# Patient Record
Sex: Female | Born: 1960
Health system: Southern US, Community
[De-identification: ages and names within clinical notes are randomized; demographics above are authoritative.]

## PROBLEM LIST (undated history)

## (undated) DIAGNOSIS — T4145XA Adverse effect of unspecified anesthetic, initial encounter: Secondary | ICD-10-CM

## (undated) DIAGNOSIS — K219 Gastro-esophageal reflux disease without esophagitis: Secondary | ICD-10-CM

## (undated) DIAGNOSIS — N83209 Unspecified ovarian cyst, unspecified side: Secondary | ICD-10-CM

## (undated) DIAGNOSIS — J45909 Unspecified asthma, uncomplicated: Secondary | ICD-10-CM

## (undated) DIAGNOSIS — F410 Panic disorder [episodic paroxysmal anxiety] without agoraphobia: Secondary | ICD-10-CM

## (undated) DIAGNOSIS — R0600 Dyspnea, unspecified: Secondary | ICD-10-CM

## (undated) DIAGNOSIS — Z87442 Personal history of urinary calculi: Secondary | ICD-10-CM

## (undated) DIAGNOSIS — K439 Ventral hernia without obstruction or gangrene: Secondary | ICD-10-CM

## (undated) DIAGNOSIS — H02403 Unspecified ptosis of bilateral eyelids: Secondary | ICD-10-CM

## (undated) DIAGNOSIS — I499 Cardiac arrhythmia, unspecified: Secondary | ICD-10-CM

## (undated) DIAGNOSIS — L680 Hirsutism: Secondary | ICD-10-CM

## (undated) DIAGNOSIS — E669 Obesity, unspecified: Secondary | ICD-10-CM

## (undated) DIAGNOSIS — I10 Essential (primary) hypertension: Secondary | ICD-10-CM

## (undated) DIAGNOSIS — J449 Chronic obstructive pulmonary disease, unspecified: Secondary | ICD-10-CM

## (undated) DIAGNOSIS — T8859XA Other complications of anesthesia, initial encounter: Secondary | ICD-10-CM

## (undated) DIAGNOSIS — I341 Nonrheumatic mitral (valve) prolapse: Secondary | ICD-10-CM

## (undated) DIAGNOSIS — F419 Anxiety disorder, unspecified: Secondary | ICD-10-CM

## (undated) DIAGNOSIS — F329 Major depressive disorder, single episode, unspecified: Secondary | ICD-10-CM

## (undated) DIAGNOSIS — E785 Hyperlipidemia, unspecified: Secondary | ICD-10-CM

## (undated) DIAGNOSIS — G4730 Sleep apnea, unspecified: Secondary | ICD-10-CM

## (undated) DIAGNOSIS — F32A Depression, unspecified: Secondary | ICD-10-CM

## (undated) DIAGNOSIS — R112 Nausea with vomiting, unspecified: Secondary | ICD-10-CM

## (undated) DIAGNOSIS — C539 Malignant neoplasm of cervix uteri, unspecified: Secondary | ICD-10-CM

## (undated) DIAGNOSIS — Z9889 Other specified postprocedural states: Secondary | ICD-10-CM

## (undated) DIAGNOSIS — Z87898 Personal history of other specified conditions: Secondary | ICD-10-CM

## (undated) HISTORY — DX: Malignant neoplasm of cervix uteri, unspecified: C53.9

## (undated) HISTORY — PX: BLEPHAROPLASTY: SUR158

## (undated) HISTORY — PX: TONSILLECTOMY AND ADENOIDECTOMY: SUR1326

## (undated) HISTORY — DX: Obesity, unspecified: E66.9

## (undated) HISTORY — DX: Depression, unspecified: F32.A

## (undated) HISTORY — PX: NEPHROLITHOTOMY: SUR881

## (undated) HISTORY — DX: Hirsutism: L68.0

## (undated) HISTORY — DX: Chronic obstructive pulmonary disease, unspecified: J44.9

## (undated) HISTORY — DX: Nonrheumatic mitral (valve) prolapse: I34.1

## (undated) HISTORY — DX: Hyperlipidemia, unspecified: E78.5

## (undated) HISTORY — DX: Major depressive disorder, single episode, unspecified: F32.9

## (undated) HISTORY — DX: Unspecified ovarian cyst, unspecified side: N83.209

## (undated) HISTORY — PX: CERVICAL CONE BIOPSY: SUR198

## (undated) HISTORY — DX: Personal history of urinary calculi: Z87.442

## (undated) HISTORY — DX: Sleep apnea, unspecified: G47.30

## (undated) HISTORY — DX: Anxiety disorder, unspecified: F41.9

## (undated) HISTORY — PX: COLONOSCOPY: SHX174

---

## 1987-03-16 DIAGNOSIS — C539 Malignant neoplasm of cervix uteri, unspecified: Secondary | ICD-10-CM

## 1987-03-16 HISTORY — PX: VAGINAL HYSTERECTOMY: SUR661

## 1987-03-16 HISTORY — DX: Malignant neoplasm of cervix uteri, unspecified: C53.9

## 2008-10-20 LAB — HM COLONOSCOPY

## 2009-02-15 ENCOUNTER — Emergency Department: Payer: Self-pay | Admitting: Emergency Medicine

## 2009-03-13 ENCOUNTER — Ambulatory Visit: Payer: Self-pay | Admitting: Emergency Medicine

## 2009-03-15 HISTORY — PX: CARPAL TUNNEL RELEASE: SHX101

## 2009-08-25 ENCOUNTER — Ambulatory Visit: Payer: Self-pay | Admitting: Specialist

## 2009-09-04 ENCOUNTER — Ambulatory Visit: Payer: Self-pay | Admitting: Specialist

## 2009-09-05 ENCOUNTER — Ambulatory Visit: Payer: Self-pay | Admitting: Specialist

## 2009-09-27 ENCOUNTER — Emergency Department: Payer: Self-pay | Admitting: Internal Medicine

## 2009-10-04 ENCOUNTER — Emergency Department: Payer: Self-pay | Admitting: Emergency Medicine

## 2009-10-23 ENCOUNTER — Ambulatory Visit: Payer: Self-pay | Admitting: Unknown Physician Specialty

## 2010-01-05 ENCOUNTER — Ambulatory Visit: Payer: Self-pay | Admitting: Unknown Physician Specialty

## 2010-01-06 LAB — PATHOLOGY REPORT

## 2010-09-22 ENCOUNTER — Ambulatory Visit: Payer: Self-pay | Admitting: Bariatrics

## 2010-11-12 ENCOUNTER — Ambulatory Visit: Payer: Self-pay | Admitting: Internal Medicine

## 2010-11-18 ENCOUNTER — Ambulatory Visit: Payer: Self-pay | Admitting: Internal Medicine

## 2010-11-19 ENCOUNTER — Telehealth: Payer: Self-pay | Admitting: Internal Medicine

## 2010-11-19 ENCOUNTER — Ambulatory Visit: Payer: Self-pay | Admitting: Internal Medicine

## 2010-11-19 NOTE — Telephone Encounter (Signed)
Great. We should request a copy of the additional imaging from [facility].

## 2010-11-19 NOTE — Telephone Encounter (Signed)
I spoke with patient and she stated she went this morning to have the additional imaging done and the radiologist looked it over and let go and told her to come back in six months.

## 2010-12-01 ENCOUNTER — Encounter: Payer: Self-pay | Admitting: Internal Medicine

## 2010-12-22 ENCOUNTER — Encounter: Payer: Self-pay | Admitting: Internal Medicine

## 2010-12-22 ENCOUNTER — Ambulatory Visit (INDEPENDENT_AMBULATORY_CARE_PROVIDER_SITE_OTHER): Payer: Medicare Other | Admitting: Internal Medicine

## 2010-12-22 ENCOUNTER — Ambulatory Visit: Payer: Self-pay | Admitting: Internal Medicine

## 2010-12-22 VITALS — BP 126/85 | HR 59 | Temp 98.8°F | Resp 20 | Ht 66.0 in | Wt 210.0 lb

## 2010-12-22 DIAGNOSIS — E669 Obesity, unspecified: Secondary | ICD-10-CM

## 2010-12-22 DIAGNOSIS — K219 Gastro-esophageal reflux disease without esophagitis: Secondary | ICD-10-CM

## 2010-12-22 DIAGNOSIS — B379 Candidiasis, unspecified: Secondary | ICD-10-CM

## 2010-12-22 DIAGNOSIS — I10 Essential (primary) hypertension: Secondary | ICD-10-CM

## 2010-12-22 DIAGNOSIS — F411 Generalized anxiety disorder: Secondary | ICD-10-CM

## 2010-12-22 DIAGNOSIS — F419 Anxiety disorder, unspecified: Secondary | ICD-10-CM

## 2010-12-22 DIAGNOSIS — E785 Hyperlipidemia, unspecified: Secondary | ICD-10-CM

## 2010-12-22 LAB — COMPREHENSIVE METABOLIC PANEL
Alkaline Phosphatase: 103 U/L (ref 39–117)
BUN: 14 mg/dL (ref 6–23)
Creatinine, Ser: 0.9 mg/dL (ref 0.4–1.2)
Glucose, Bld: 104 mg/dL — ABNORMAL HIGH (ref 70–99)
Total Bilirubin: 0.6 mg/dL (ref 0.3–1.2)

## 2010-12-22 LAB — CBC WITH DIFFERENTIAL/PLATELET
Basophils Relative: 0.4 % (ref 0.0–3.0)
Eosinophils Relative: 1.7 % (ref 0.0–5.0)
HCT: 42.9 % (ref 36.0–46.0)
Lymphs Abs: 2.2 10*3/uL (ref 0.7–4.0)
MCV: 82.3 fl (ref 78.0–100.0)
Monocytes Absolute: 0.4 10*3/uL (ref 0.1–1.0)
Platelets: 168 10*3/uL (ref 150.0–400.0)
RBC: 5.21 Mil/uL — ABNORMAL HIGH (ref 3.87–5.11)
WBC: 7.5 10*3/uL (ref 4.5–10.5)

## 2010-12-22 LAB — LIPID PANEL: Triglycerides: 381 mg/dL — ABNORMAL HIGH (ref 0.0–149.0)

## 2010-12-22 LAB — LDL CHOLESTEROL, DIRECT: Direct LDL: 164.1 mg/dL

## 2010-12-22 MED ORDER — METOPROLOL SUCCINATE ER 50 MG PO TB24: 50.0000 mg | ORAL_TABLET | Freq: Every day | ORAL | Status: DC

## 2010-12-22 MED ORDER — ESOMEPRAZOLE MAGNESIUM 40 MG PO CPDR: 40.0000 mg | DELAYED_RELEASE_CAPSULE | Freq: Two times a day (BID) | ORAL | Status: DC

## 2010-12-22 MED ORDER — NYSTATIN 100000 UNIT/GM EX POWD: 1.0000 g | Freq: Two times a day (BID) | CUTANEOUS | Status: DC

## 2010-12-22 NOTE — Progress Notes (Signed)
Subjective:    Patient ID: Angel Moss, female    DOB: [birth year], 50 y.o.   MRN: [id number]  HPI 50YO female with obesity and hyperlipidemia presents for follow up. Her primary concern today is pain and rash under her breasts.  She notes itching and foul smell from this location. She was previously treated with nystop with improvement, but symptoms have recurred.  She questions whether breast reduction might help.  She notes recent weight loss of nearly 20lbs. She has been limiting food intake, especially carbohydrates. She is not keeping a food diary.  She has not started exercising.  She also notes she has stopped CPAP. She was unable to tolerate mask because of claustrophobia.  Outpatient Encounter Prescriptions as of 12/22/2010  Medication Sig Dispense Refill  . Biotin 5000 MCG TABS Take 3 tablets by mouth daily.        . Cholecalciferol (VITAMIN D3) 5000 UNITS TABS Take 1 tablet by mouth daily.        . clonazePAM (KLONOPIN) 1 MG tablet Take 1 mg by mouth 2 (two) times daily as needed.        . Flaxseed, Linseed, (FLAX SEED OIL) 1000 MG CAPS Take by mouth. 2 tablets in am and 2 in the pm        . fluticasone (FLOVENT HFA) 110 MCG/ACT inhaler Inhale 2 puffs into the lungs 2 (two) times daily.        . metoprolol (TOPROL-XL) 50 MG 24 hr tablet Take 50 mg by mouth daily.       Marland Kitchen NEXIUM 40 MG capsule Take 40 mg by mouth 2 (two) times daily.       . sertraline (ZOLOFT) 100 MG tablet Take 100 mg by mouth 2 (two) times daily.       . simvastatin (ZOCOR) 10 MG tablet Take 10 mg by mouth at bedtime.       . VENTOLIN HFA 108 (90 BASE) MCG/ACT inhaler Inhale 2 puffs into the lungs as needed.       . vitamin B-12 (CYANOCOBALAMIN) 500 MCG tablet Take 500 mcg by mouth daily.          Review of Systems  Constitutional: Negative for fever, chills, appetite change, fatigue and unexpected weight change.  HENT: Negative for ear pain, congestion, sore throat, trouble swallowing, neck pain, voice  change and sinus pressure.   Eyes: Negative for visual disturbance.  Respiratory: Negative for cough, shortness of breath, wheezing and stridor.   Cardiovascular: Negative for chest pain, palpitations and leg swelling.  Gastrointestinal: Negative for nausea, vomiting, abdominal pain, diarrhea, constipation, blood in stool, abdominal distention and anal bleeding.  Genitourinary: Negative for dysuria and flank pain.  Musculoskeletal: Negative for myalgias, arthralgias and gait problem.  Skin: Positive for color change and rash.  Neurological: Negative for dizziness and headaches.  Hematological: Negative for adenopathy. Does not bruise/bleed easily.  Psychiatric/Behavioral: Negative for suicidal ideas, sleep disturbance and dysphoric mood. The patient is nervous/anxious.    BP 126/85  Pulse 59  Temp(Src) 98.8 F (37.1 C) (Oral)  Resp 20  Wt 212 lb 12 oz (96.503 kg)  SpO2 97%     Objective:   Physical Exam  Constitutional: She is oriented to person, place, and time. She appears well-developed and well-nourished. No distress.  HENT:  Head: Normocephalic and atraumatic.  Right Ear: External ear normal.  Left Ear: External ear normal.  Nose: Nose normal.  Mouth/Throat: Oropharynx is clear and moist. No oropharyngeal exudate.  Eyes: Conjunctivae  are normal. Pupils are equal, round, and reactive to light. Right eye exhibits no discharge. Left eye exhibits no discharge. No scleral icterus.  Neck: Normal range of motion. Neck supple. No tracheal deviation present. No thyromegaly present.  Cardiovascular: Normal rate, regular rhythm, normal heart sounds and intact distal pulses.  Exam reveals no gallop and no friction rub.   No murmur heard. Pulmonary/Chest: Effort normal and breath sounds normal. No respiratory distress. She has no wheezes. She has no rales. She exhibits no tenderness.  Musculoskeletal: Normal range of motion. She exhibits no edema and no tenderness.  Lymphadenopathy:     She has no cervical adenopathy.  Neurological: She is alert and oriented to person, place, and time. No cranial nerve deficit. She exhibits normal muscle tone. Coordination normal.  Skin: Skin is warm and dry. Rash noted. Rash is macular. She is not diaphoretic. No erythema. No pallor.     Psychiatric: She has a normal mood and affect. Her behavior is normal. Judgment and thought content normal.          Assessment & Plan:  1. Obesity - Congratulated pt on weight loss. Encouraged reduction in saturated fat and increased physical activity--goal of 5 days per week. Will follow up in 10month.  2. Hyperlipidemia - Will check lipids and LFTs today.  3. Candidiasis of skin - Will treat with nystop. I don't think she would be a candidate for breast reduction until BMI<30, but I encouraged her to look at her insurance coverage for this.  4. Sleep apnea - Pt stopped CPAP. She understands risk of apneic events and long term sequelae of OSA. Will plan to repeat sleep study when weight loss complete (or below 200lbs).

## 2010-12-30 ENCOUNTER — Telehealth: Payer: Self-pay | Admitting: Internal Medicine

## 2010-12-30 NOTE — Telephone Encounter (Signed)
error 

## 2011-01-01 ENCOUNTER — Encounter: Payer: Self-pay | Admitting: Internal Medicine

## 2011-01-05 ENCOUNTER — Other Ambulatory Visit: Payer: Self-pay | Admitting: Internal Medicine

## 2011-01-05 MED ORDER — SIMVASTATIN 40 MG PO TABS: 40.0000 mg | ORAL_TABLET | Freq: Every evening | ORAL | Status: DC

## 2011-01-22 ENCOUNTER — Ambulatory Visit (INDEPENDENT_AMBULATORY_CARE_PROVIDER_SITE_OTHER): Payer: Medicare Other | Admitting: Internal Medicine

## 2011-01-22 ENCOUNTER — Encounter: Payer: Self-pay | Admitting: Internal Medicine

## 2011-01-22 VITALS — BP 130/70 | HR 70 | Temp 98.5°F | Wt 208.0 lb

## 2011-01-22 DIAGNOSIS — J449 Chronic obstructive pulmonary disease, unspecified: Secondary | ICD-10-CM

## 2011-01-22 DIAGNOSIS — E785 Hyperlipidemia, unspecified: Secondary | ICD-10-CM

## 2011-01-22 DIAGNOSIS — F419 Anxiety disorder, unspecified: Secondary | ICD-10-CM

## 2011-01-22 DIAGNOSIS — F411 Generalized anxiety disorder: Secondary | ICD-10-CM

## 2011-01-22 DIAGNOSIS — K529 Noninfective gastroenteritis and colitis, unspecified: Secondary | ICD-10-CM

## 2011-01-22 DIAGNOSIS — K5289 Other specified noninfective gastroenteritis and colitis: Secondary | ICD-10-CM

## 2011-01-22 DIAGNOSIS — J4489 Other specified chronic obstructive pulmonary disease: Secondary | ICD-10-CM

## 2011-01-22 LAB — CBC
MCH: 27.2 pg (ref 26.0–34.0)
MCV: 79.5 fL (ref 78.0–100.0)
Platelets: 136 10*3/uL — ABNORMAL LOW (ref 150–400)
RDW: 13.8 % (ref 11.5–15.5)

## 2011-01-22 LAB — LIPID PANEL
HDL: 34.2 mg/dL — ABNORMAL LOW (ref >39.00)
Total CHOL/HDL Ratio: 5
VLDL: 67 mg/dL — ABNORMAL HIGH (ref 0.0–40.0)

## 2011-01-22 LAB — COMPREHENSIVE METABOLIC PANEL
ALT: 36 U/L — ABNORMAL HIGH (ref 0–35)
AST: 21 U/L (ref 0–37)
Albumin: 3.9 g/dL (ref 3.5–5.2)
Alkaline Phosphatase: 103 U/L (ref 39–117)
Potassium: 4.4 mEq/L (ref 3.5–5.1)
Sodium: 139 mEq/L (ref 135–145)
Total Protein: 6.8 g/dL (ref 6.0–8.3)

## 2011-01-22 MED ORDER — METRONIDAZOLE 500 MG PO TABS: 500.0000 mg | ORAL_TABLET | Freq: Three times a day (TID) | ORAL | Status: AC

## 2011-01-22 MED ORDER — ALPRAZOLAM 0.5 MG PO TABS: 0.5000 mg | ORAL_TABLET | Freq: Three times a day (TID) | ORAL | Status: DC | PRN

## 2011-01-22 NOTE — Patient Instructions (Signed)
Use alprazolam only for severe anxiety.   Follow up 2weeks.

## 2011-01-22 NOTE — Progress Notes (Signed)
Subjective:    Patient ID: Angel Moss, female    DOB: 15-Aug-1960, 50 y.o.   MRN: [id number]  HPI 50 year old female with a history of obesity and anxiety presents for an acute visit complaining of a one-week history of diarrhea, nausea and vomiting.  During this time she reports general malaise, fever, and chills. She denies any known sick contacts however had spent time visiting a friend in the hospital. Her diarrhea persisted over the last week. She notes that her diarrhea is watery and nonbloody. She has diarrhea immediately after eating. She has had crampy abdominal pain. Her nausea and vomiting have now resolved. She continues to feel some fatigue and general malaise. She notes that during this time her anxiety has been severe. She attributes this to inability to keep down her scheduled Clonopin. She did take a friend's dose of Xanax with improvement in her symptoms.  Outpatient Encounter Prescriptions as of 01/22/2011  Medication Sig Dispense Refill  . Biotin 5000 MCG TABS Take 3 tablets by mouth daily.        . Cholecalciferol (VITAMIN D3) 5000 UNITS TABS Take 1 tablet by mouth daily.        . clonazePAM (KLONOPIN) 1 MG tablet Take 1 mg by mouth 2 (two) times daily as needed.        Marland Kitchen esomeprazole (NEXIUM) 40 MG capsule Take 1 capsule (40 mg total) by mouth 2 (two) times daily.  180 capsule  4  . Flaxseed, Linseed, (FLAX SEED OIL) 1000 MG CAPS Take by mouth. 2 tablets in am and 2 in the pm        . fluticasone (FLOVENT HFA) 110 MCG/ACT inhaler Inhale 2 puffs into the lungs 2 (two) times daily.        . metoprolol (TOPROL-XL) 50 MG 24 hr tablet Take 1 tablet (50 mg total) by mouth daily.  90 tablet  4  . nystatin (NYSTOP) 100000 UNIT/GM POWD Apply 1 g (100,000 Units total) topically 2 (two) times daily.  60 g  3  . sertraline (ZOLOFT) 100 MG tablet Take 100 mg by mouth 2 (two) times daily.       . simvastatin (ZOCOR) 40 MG tablet Take 1 tablet (40 mg total) by mouth every evening.  90  tablet  6  . VENTOLIN HFA 108 (90 BASE) MCG/ACT inhaler Inhale 2 puffs into the lungs as needed.       . vitamin B-12 (CYANOCOBALAMIN) 500 MCG tablet Take 500 mcg by mouth daily.          Review of Systems  Constitutional: Positive for chills and fatigue. Negative for fever, appetite change and unexpected weight change.  HENT: Positive for voice change. Negative for ear pain, congestion, sore throat, trouble swallowing, neck pain and sinus pressure.   Eyes: Negative for visual disturbance.  Respiratory: Negative for cough, shortness of breath, wheezing and stridor.   Cardiovascular: Negative for chest pain, palpitations and leg swelling.  Gastrointestinal: Positive for vomiting, abdominal pain, diarrhea, blood in stool (now resolved) and abdominal distention. Negative for nausea, constipation and anal bleeding.  Genitourinary: Negative for dysuria and flank pain.  Musculoskeletal: Negative for myalgias, arthralgias and gait problem.  Skin: Negative for color change and rash.  Neurological: Positive for light-headedness. Negative for dizziness and headaches.  Hematological: Negative for adenopathy. Does not bruise/bleed easily.  Psychiatric/Behavioral: Negative for dysphoric mood. The patient is nervous/anxious.    BP 130/70  Pulse 70  Temp(Src) 98.5 F (36.9 C) (Oral)  Wt 208  lb (94.348 kg)  SpO2 96%     Objective:   Physical Exam  Constitutional: She is oriented to person, place, and time. She appears well-developed and well-nourished. No distress.  HENT:  Head: Normocephalic and atraumatic.  Right Ear: External ear normal.  Left Ear: External ear normal.  Nose: Nose normal.  Mouth/Throat: Oropharynx is clear and moist. No oropharyngeal exudate.  Eyes: Conjunctivae are normal. Pupils are equal, round, and reactive to light. Right eye exhibits no discharge. Left eye exhibits no discharge. No scleral icterus.  Neck: Normal range of motion. Neck supple. No tracheal deviation  present. No thyromegaly present.  Cardiovascular: Normal rate, regular rhythm, normal heart sounds and intact distal pulses.  Exam reveals no gallop and no friction rub.   No murmur heard. Pulmonary/Chest: Effort normal and breath sounds normal. No respiratory distress. She has no wheezes. She has no rales. She exhibits no tenderness.  Abdominal: Soft. Bowel sounds are normal. She exhibits no distension and no mass. There is no tenderness. There is no rebound and no guarding.  Musculoskeletal: Normal range of motion. She exhibits no edema and no tenderness.  Lymphadenopathy:    She has no cervical adenopathy.  Neurological: She is alert and oriented to person, place, and time. No cranial nerve deficit. She exhibits normal muscle tone. Coordination normal.  Skin: Skin is warm and dry. No rash noted. She is not diaphoretic. No erythema. No pallor.  Psychiatric: She has a normal mood and affect. Her behavior is normal. Judgment and thought content normal.          Assessment & Plan:  1. Gastroenteritis - likely viral, however will send stool culture to evaluate for bacterial causes. Will check CMP, CBC. Will start Flagyl. Patient will call or return to clinic if symptoms are not improving over the next 24-48 hours. Patient will followup in 2 weeks.  2. Anxiety -worsened likely secondary to withdrawal from suddenly stopping Clonopin. Patient had been using a friend's Xanax over the last couple of days with improvement in symptoms. We discussed the risk of suddenly stopping Clonopin or Xanax. She will continue her Klonopin and use Xanax only as needed for severe anxiety over the next few weeks. She will need to stop the Xanax after this point. She will also followup with her psychiatrist in regards to her chronic anxiety.  3. Hyperlipidemia - will recheck lipids with labs today.

## 2011-01-26 LAB — STOOL CULTURE

## 2011-02-01 ENCOUNTER — Institutional Professional Consult (permissible substitution): Payer: Medicare Other | Admitting: Pulmonary Disease

## 2011-02-02 ENCOUNTER — Other Ambulatory Visit: Payer: Medicare Other

## 2011-02-09 ENCOUNTER — Ambulatory Visit: Payer: Medicare Other | Admitting: Internal Medicine

## 2011-02-17 ENCOUNTER — Encounter: Payer: Self-pay | Admitting: Internal Medicine

## 2011-04-02 ENCOUNTER — Ambulatory Visit: Payer: Medicare Other | Admitting: Internal Medicine

## 2011-04-26 ENCOUNTER — Telehealth: Payer: Self-pay | Admitting: Internal Medicine

## 2011-04-26 DIAGNOSIS — Z1231 Encounter for screening mammogram for malignant neoplasm of breast: Secondary | ICD-10-CM

## 2011-04-26 NOTE — Telephone Encounter (Signed)
Patient is needing an order for a Mammogram.

## 2011-04-26 NOTE — Telephone Encounter (Signed)
Order completed

## 2011-05-11 ENCOUNTER — Telehealth: Payer: Self-pay | Admitting: *Deleted

## 2011-05-11 DIAGNOSIS — R928 Other abnormal and inconclusive findings on diagnostic imaging of breast: Secondary | ICD-10-CM

## 2011-05-11 DIAGNOSIS — R922 Inconclusive mammogram: Secondary | ICD-10-CM

## 2011-05-11 NOTE — Telephone Encounter (Signed)
Mammogram reordered. 

## 2011-05-24 ENCOUNTER — Ambulatory Visit: Payer: Self-pay | Admitting: Internal Medicine

## 2011-06-01 ENCOUNTER — Encounter: Payer: Self-pay | Admitting: Internal Medicine

## 2011-06-07 LAB — HM MAMMOGRAPHY: HM Mammogram: NORMAL

## 2011-07-06 ENCOUNTER — Telehealth: Payer: Self-pay | Admitting: Internal Medicine

## 2011-07-06 NOTE — Telephone Encounter (Signed)
Patient is asking if she can have new rx for nexium increased to 2 tablets in am and 2 in the pm of the 40 mg tablets.

## 2011-07-06 NOTE — Telephone Encounter (Signed)
[phone] Pt would like to have her nixium increased To 2 - 40mg  am and  2 - 40 mg in pm [facility]  [address]

## 2011-07-07 NOTE — Telephone Encounter (Signed)
RC to pt and advised of below.  Pt continues to have symptoms at night.  She does admit to eating snacks before bed.  Reminded of GERD prevention techniques.  She will call with any additional questions.

## 2011-07-07 NOTE — Telephone Encounter (Signed)
There is no indication for this dose of Nexium.  Over 90% acid production blocked with 40mg  daily nexium.

## 2011-07-30 ENCOUNTER — Other Ambulatory Visit: Payer: Self-pay | Admitting: Internal Medicine

## 2011-08-02 ENCOUNTER — Other Ambulatory Visit: Payer: Self-pay | Admitting: Internal Medicine

## 2011-08-03 ENCOUNTER — Telehealth: Payer: Self-pay | Admitting: Internal Medicine

## 2011-08-03 NOTE — Telephone Encounter (Signed)
She is going to stop taking her Clonazepam and she needs a prior authorization for her Alprazolam 0.5 mg.

## 2011-10-20 ENCOUNTER — Telehealth: Payer: Self-pay | Admitting: Internal Medicine

## 2011-10-20 ENCOUNTER — Emergency Department: Payer: Self-pay

## 2011-10-20 LAB — CBC
HCT: 40 % (ref 35.0–47.0)
HGB: 13.6 g/dL (ref 12.0–16.0)
MCH: 27.8 pg (ref 26.0–34.0)
MCHC: 34.1 g/dL (ref 32.0–36.0)
MCV: 82 fL (ref 80–100)
RBC: 4.89 10*6/uL (ref 3.80–5.20)

## 2011-10-20 LAB — COMPREHENSIVE METABOLIC PANEL
Anion Gap: 9 (ref 7–16)
BUN: 21 mg/dL — ABNORMAL HIGH (ref 7–18)
Calcium, Total: 8.5 mg/dL (ref 8.5–10.1)
Chloride: 109 mmol/L — ABNORMAL HIGH (ref 98–107)
Creatinine: 0.85 mg/dL (ref 0.60–1.30)
EGFR (African American): >60
Glucose: 96 mg/dL (ref 65–99)
Potassium: 3.7 mmol/L (ref 3.5–5.1)
SGOT(AST): 27 U/L (ref 15–37)
SGPT (ALT): 39 U/L (ref 12–78)
Sodium: 142 mmol/L (ref 136–145)
Total Protein: 7.3 g/dL (ref 6.4–8.2)

## 2011-10-20 LAB — URINALYSIS, COMPLETE
Glucose,UR: NEGATIVE mg/dL (ref 0–75)
Hyaline Cast: 6
Ph: 5 (ref 4.5–8.0)
Protein: 100
RBC,UR: 17 /HPF (ref 0–5)
Squamous Epithelial: 3

## 2011-10-20 NOTE — Telephone Encounter (Signed)
Caller: Angel Moss; PCP: Ronna Polio; CB#: [phone]; Hysterectomy. 10/20/11 - Blood in urine x 3 weeks. Has been dark colored blood, but today has a small amount of bright red come out when she was in the shower. Low Adominal Pain. Low Back Pain. Afebrile. Urine is "orange" colored. Emergent Sign/Symptom of "urinary tract symptoms AND any flank, low back, lower abdominal or genital area pain" per Bloody Urine Protocol. Disposition See in 4 hours and office closes in 30 minutes. Advised patient to be seen and evaluated at Urgent Care, but can't go due to medicare. States that she will go to Emergency Room to be evaluated. Care Advice Given.

## 2011-10-21 ENCOUNTER — Encounter: Payer: Self-pay | Admitting: Internal Medicine

## 2011-10-21 ENCOUNTER — Ambulatory Visit (INDEPENDENT_AMBULATORY_CARE_PROVIDER_SITE_OTHER): Payer: Medicare Other | Admitting: Internal Medicine

## 2011-10-21 ENCOUNTER — Telehealth: Payer: Self-pay | Admitting: Internal Medicine

## 2011-10-21 VITALS — BP 114/68 | HR 63 | Temp 98.0°F | Resp 16 | Wt 212.5 lb

## 2011-10-21 DIAGNOSIS — G8929 Other chronic pain: Secondary | ICD-10-CM

## 2011-10-21 DIAGNOSIS — F17200 Nicotine dependence, unspecified, uncomplicated: Secondary | ICD-10-CM

## 2011-10-21 DIAGNOSIS — Z720 Tobacco use: Secondary | ICD-10-CM

## 2011-10-21 DIAGNOSIS — R1031 Right lower quadrant pain: Secondary | ICD-10-CM

## 2011-10-21 DIAGNOSIS — R319 Hematuria, unspecified: Secondary | ICD-10-CM

## 2011-10-21 DIAGNOSIS — R079 Chest pain, unspecified: Secondary | ICD-10-CM

## 2011-10-21 DIAGNOSIS — R310 Gross hematuria: Secondary | ICD-10-CM

## 2011-10-21 NOTE — Telephone Encounter (Signed)
I have requested the records from the ER visit for the appt with Dr. Darrick Huntsman.

## 2011-10-21 NOTE — Progress Notes (Signed)
Patient ID: Angel Moss, female   DOB: [birth year], 51 y.o.   MRN: [id number]  Patient Active Problem List  Diagnosis  . Hypertension  . GERD (gastroesophageal reflux disease)  . Hyperlipidemia  . Obesity  . Hematuria, gross  . Chest pain at rest  . Tobacco abuse    Subjective:  CC:   Chief Complaint  Patient presents with  . Hematuria    HPI:   Angel Moss a 51 y.o. female who presents Several subacute complaints.  2 weeks ago had an episode of crushing chest pain radiating to jaw , thought maybe she was having a panic attack,  Lasted 2 or 3 minutes,  occurred while sitting at the computer at [employer].  Symptoms resolved so she went shopping rather than being evaluated by PCP or ER. She has not had a recurrent episode but has been having dyspnea with minimal exertion.   She hasCRFs of obesity, untreated sleep apnea, and tobacco abuse and does not exercise regularly or at all. 2)  gross hematuria. She has a history of  kidney stones 10 years ago resulting in lithotripsy by Dr. Achilles Dunk  who advised repeat procedure  persistent large stone  which patient deferred.  She  has not had recurrence of abdominal pain or hematuria until  2 weeks ago, when she developed bilateral flank  pain and lower abdominal pain . She has had several episodes of passing blood.  Has had a partial hysterectomy due to cerivcal cancer.   She went to th ER on August 7thbut  left without being evaluated by the ED physician or cardiologist.   no EKG is available but her troponin I was normal. positive for blood    Past Medical History  Diagnosis Date  . Obesity   . Anxiety     History reviewed. No pertinent past surgical history.       The following portions of the patient's history were reviewed and updated as appropriate: Allergies, current medications, and problem list.    Review of Systems:   A comprehensive ROS was done and positive for chest pain, dyspnea and flank pain per HPI.  The rest was  negative.12 Pt  review of systems was negative      History   Social History  . Marital Status: Single    Spouse Name: N/A    Number of Children: N/A  . Years of Education: N/A   Occupational History  . Not on file.   Social History Main Topics  . Smoking status: Current Everyday Smoker    Types: Cigarettes  . Smokeless tobacco: Never Used  . Alcohol Use: No  . Drug Use: No  . Sexually Active: Not on file   Other Topics Concern  . Not on file   Social History Narrative  . No narrative on file    Objective:  BP 114/68  Pulse 63  Temp 98 F (36.7 C) (Oral)  Resp 16  Wt 212 lb 8 oz (96.389 kg)  SpO2 95%  General appearance: alert, cooperative and appears stated age Ears: normal TM's and external ear canals both ears Throat: lips, mucosa, and tongue normal; teeth and gums normal Neck: no adenopathy, no carotid bruit, supple, symmetrical, trachea midline and thyroid not enlarged, symmetric, no tenderness/mass/nodules Back: symmetric, no curvature. ROM normal. No CVA tenderness. Lungs: clear to auscultation bilaterally Heart: regular rate and rhythm, S1, S2 normal, no murmur, click, rub or gallop Abdomen: soft, non-tender; bowel sounds normal; no masses,  no organomegaly  Pulses: 2+ and symmetric Skin: Skin color, texture, turgor normal. No rashes or lesions Lymph nodes: Cervical, supraclavicular, and axillary nodes normal.  Assessment and Plan:  Hematuria, gross With history of untreated kidney stones and current tobacco abuse reason for suspicion of kidney stones and renal cell carcinoma. I have ordered a CT scan of abdomen and pelvis which noted a 12 x 7 mm stone in the left renal pelvis, a 3 x 2 mm stone in the lower pole pole of the right renal collecting system at 1 mm stone in the mid right kidney. No hydronephrosis or hydroureter was demonstrated no bladder calculi were evident. Gallbladder stones were also seen. The contrasted study showed 1 cm cyst on the  left kidney and unremarkable bladder spleen liver pancreas and adrenal glands. Appendix was also visualized and normal. She will need to followup with Dr. Achilles Dunk if she cannot pass the stone in the next week or 2.  Chest pain at rest Given her multiple risk factors for coronary artery disease I am recommending that she he seen by the lower cardiology for assessment. EKG was normal today. Her last fasting lipid panel was in November at which time her triglycerides were over 300,  HDL is under 35,  and LDL is 84. She will also need followup with her PCP to address her dyslipidemia.  Tobacco abuse Risks of continued tobacco use were discussed. She is not currently interested in tobacco cessation.    Updated Medication List Outpatient Encounter Prescriptions as of 10/21/2011  Medication Sig Dispense Refill  . ALPRAZolam (XANAX) 0.5 MG tablet TAKE ONE TABLET BY MOUTH THREE TIMES DAILY AS NEEDED FOR SLEEP OR ANXIETY  90 tablet  3  . Biotin 5000 MCG TABS Take 3 tablets by mouth daily.        . Cholecalciferol (VITAMIN D3) 5000 UNITS TABS Take 1 tablet by mouth daily.        . clonazePAM (KLONOPIN) 1 MG tablet Take 1 mg by mouth 2 (two) times daily as needed.        Marland Kitchen esomeprazole (NEXIUM) 40 MG capsule Take 1 capsule (40 mg total) by mouth 2 (two) times daily.  180 capsule  4  . Flaxseed, Linseed, (FLAX SEED OIL) 1000 MG CAPS Take by mouth. 2 tablets in am and 2 in the pm        . fluticasone (FLOVENT HFA) 110 MCG/ACT inhaler Inhale 2 puffs into the lungs 2 (two) times daily.        . metoprolol (TOPROL-XL) 50 MG 24 hr tablet Take 1 tablet (50 mg total) by mouth daily.  90 tablet  4  . nystatin (NYSTOP) 100000 UNIT/GM POWD Apply 1 g (100,000 Units total) topically 2 (two) times daily.  60 g  3  . sertraline (ZOLOFT) 100 MG tablet Take 100 mg by mouth 2 (two) times daily.       . simvastatin (ZOCOR) 40 MG tablet Take 1 tablet (40 mg total) by mouth every evening.  90 tablet  6  . VENTOLIN HFA 108 (90  BASE) MCG/ACT inhaler Inhale 2 puffs into the lungs as needed.       . vitamin B-12 (CYANOCOBALAMIN) 500 MCG tablet Take 500 mcg by mouth daily.           Orders Placed This Encounter  Procedures  . CT Abdomen Pelvis W Wo Contrast  . Ambulatory referral to Cardiology  . EKG 12-Lead  . HM COLONOSCOPY    No Follow-up on file.

## 2011-10-21 NOTE — Telephone Encounter (Signed)
Caller: Sharmane/Patient; PCP: Ronna Polio; CB#: [phone];  Call regarding Urinary Pain/Bleeding; blood noted in urine for 2-3 weeks; around 10/07/11; sx are still present; on 10/14/11  she was at [employer] and devolped "crushing pain" to chest with jaw and arm was tingling; lasted 3 minutes; never was evaluated;  called on 10/20/11 and instructed to go to ER; pt went to [facility] emergency room on 10/20/11 at 6:00pm and had lab work drawn but left before seen by MD; pt doesn't have her lab or EKG results; pt would like Dr Dan Humphreys to obtain these results; symptoms currently present include pt continues to have bright red blood in urine with lower abdominal pain and back pain; rates 4/10; See in 4hr due to urinary Symptoms and any low back low abdominal area pain; pt questioning if she really needs to be seen; stressed importance of appt in 4 hr; appt made for today 10/21/11 at 10:30am Dr Darrick Huntsman; will comply

## 2011-10-22 ENCOUNTER — Ambulatory Visit: Payer: Self-pay | Admitting: Internal Medicine

## 2011-10-24 ENCOUNTER — Encounter: Payer: Self-pay | Admitting: Internal Medicine

## 2011-10-24 DIAGNOSIS — R079 Chest pain, unspecified: Secondary | ICD-10-CM | POA: Insufficient documentation

## 2011-10-24 DIAGNOSIS — R310 Gross hematuria: Secondary | ICD-10-CM | POA: Insufficient documentation

## 2011-10-24 DIAGNOSIS — Z720 Tobacco use: Secondary | ICD-10-CM | POA: Insufficient documentation

## 2011-10-24 NOTE — Assessment & Plan Note (Signed)
Risks of continued tobacco use were discussed. She is not currently interested in tobacco cessation.    

## 2011-10-24 NOTE — Assessment & Plan Note (Signed)
Given her multiple risk factors for coronary artery disease I am recommending that she he seen by the lower cardiology for assessment. EKG was normal today. Her last fasting lipid panel was in November at which time her triglycerides were over 300,  HDL is under 35,  and LDL is 84. She will also need followup with her PCP to address her dyslipidemia.

## 2011-10-24 NOTE — Assessment & Plan Note (Signed)
With history of untreated kidney stones and current tobacco abuse reason for suspicion of kidney stones and renal cell carcinoma. I have ordered a CT scan of abdomen and pelvis which noted a 12 x 7 mm stone in the left renal pelvis, a 3 x 2 mm stone in the lower pole pole of the right renal collecting system at 1 mm stone in the mid right kidney. No hydronephrosis or hydroureter was demonstrated no bladder calculi were evident. Gallbladder stones were also seen. The contrasted study showed 1 cm cyst on the left kidney and unremarkable bladder spleen liver pancreas and adrenal glands. Appendix was also visualized and normal. She will need to followup with Dr. Achilles Dunk if she cannot pass the stone in the next week or 2.

## 2011-10-25 ENCOUNTER — Telehealth: Payer: Self-pay | Admitting: Internal Medicine

## 2011-10-25 DIAGNOSIS — N200 Calculus of kidney: Secondary | ICD-10-CM

## 2011-10-25 NOTE — Telephone Encounter (Signed)
Her abdominal CT does show several stones on both sides. They are not blocking the kidney the bladder if she does not pass them in the next several days she will need to see Dr. Achilles Dunk again if she continues to have pain and blood in her urine

## 2011-10-25 NOTE — Telephone Encounter (Signed)
Patient notified, Angel Moss stated Angel Moss is in severe pain in her back and needs to be referred back to Dr. Achilles Dunk because it has been 10 years since Angel Moss saw him, Angel Moss wants to go ahead and be seen by him.  Please advise.

## 2011-10-25 NOTE — Telephone Encounter (Signed)
Urology referral  in epic

## 2011-10-27 ENCOUNTER — Telehealth: Payer: Self-pay | Admitting: Internal Medicine

## 2011-10-27 NOTE — Telephone Encounter (Signed)
The patient called this morning about her referral appointments stating she will not go to [place] for a urology appointment , she will make her own appointment with Dr. Achilles Dunk. if she can't make the appointment she will call this office to make a referral . She stated she is going to cancel the Cardiology appointment because she is not concerned with that right now her biggest concern is the kidney stones.

## 2011-10-27 NOTE — Telephone Encounter (Signed)
Patient called back this morning stating she did make an appointment with Dr. Achilles Dunk for 11/17/11 at 8:45 they will call patient if they have an earlier appointment to get the 8 kidney stones out.

## 2011-10-28 ENCOUNTER — Ambulatory Visit: Payer: Medicare Other | Admitting: Cardiovascular Disease

## 2011-11-01 ENCOUNTER — Encounter: Payer: Self-pay | Admitting: Internal Medicine

## 2011-11-03 ENCOUNTER — Telehealth: Payer: Self-pay | Admitting: Internal Medicine

## 2011-11-03 DIAGNOSIS — Z1211 Encounter for screening for malignant neoplasm of colon: Secondary | ICD-10-CM

## 2011-11-03 NOTE — Telephone Encounter (Signed)
Pt would like to get a referral to have colonscopy   Dr Markham Jordan

## 2011-11-17 ENCOUNTER — Ambulatory Visit: Payer: Self-pay | Admitting: Urology

## 2011-11-23 ENCOUNTER — Ambulatory Visit: Payer: Self-pay | Admitting: Urology

## 2011-11-23 HISTORY — PX: KIDNEY STONE SURGERY: SHX686

## 2011-11-23 LAB — PLATELET COUNT: Platelet: 132 10*3/uL — ABNORMAL LOW (ref 150–440)

## 2011-11-23 LAB — PROTIME-INR: Prothrombin Time: 12 secs (ref 11.5–14.7)

## 2011-11-23 LAB — APTT: Activated PTT: 25.7 secs (ref 23.6–35.9)

## 2011-11-24 LAB — CBC WITH DIFFERENTIAL/PLATELET
Basophil #: 0 10*3/uL (ref 0.0–0.1)
Eosinophil #: 0.1 10*3/uL (ref 0.0–0.7)
Eosinophil %: 2 %
HCT: 36.4 % (ref 35.0–47.0)
Lymphocyte #: 1.9 10*3/uL (ref 1.0–3.6)
MCHC: 34.3 g/dL (ref 32.0–36.0)
MCV: 81 fL (ref 80–100)
Monocyte #: 0.4 x10 3/mm (ref 0.2–0.9)
Monocyte %: 6.1 %
Neutrophil #: 4.2 10*3/uL (ref 1.4–6.5)
Neutrophil %: 62.6 %
Platelet: 116 10*3/uL — ABNORMAL LOW (ref 150–440)
RDW: 14.2 % (ref 11.5–14.5)
WBC: 6.7 10*3/uL (ref 3.6–11.0)

## 2011-11-24 LAB — BASIC METABOLIC PANEL
Anion Gap: 8 (ref 7–16)
Calcium, Total: 7.8 mg/dL — ABNORMAL LOW (ref 8.5–10.1)
Co2: 27 mmol/L (ref 21–32)
Creatinine: 0.75 mg/dL (ref 0.60–1.30)
EGFR (African American): >60
EGFR (Non-African Amer.): >60
Glucose: 115 mg/dL — ABNORMAL HIGH (ref 65–99)
Potassium: 3.7 mmol/L (ref 3.5–5.1)
Sodium: 143 mmol/L (ref 136–145)

## 2011-12-14 HISTORY — PX: OTHER SURGICAL HISTORY: SHX169

## 2011-12-16 ENCOUNTER — Other Ambulatory Visit: Payer: Self-pay | Admitting: Internal Medicine

## 2012-01-03 ENCOUNTER — Other Ambulatory Visit: Payer: Self-pay | Admitting: Internal Medicine

## 2012-01-05 ENCOUNTER — Other Ambulatory Visit: Payer: Self-pay | Admitting: Internal Medicine

## 2012-01-19 ENCOUNTER — Other Ambulatory Visit: Payer: Self-pay | Admitting: Internal Medicine

## 2012-01-24 ENCOUNTER — Telehealth: Payer: Self-pay | Admitting: Internal Medicine

## 2012-01-24 NOTE — Telephone Encounter (Signed)
I will update FYI in patients chart, information sent to be scanned into patients chart.

## 2012-01-24 NOTE — Telephone Encounter (Signed)
We received information from your insurance company that you have received narcotics and benzodiazepines from 4 different providers. We will no longer be able to prescribe benzodiazepines or narcotics from our office.

## 2012-02-02 ENCOUNTER — Other Ambulatory Visit: Payer: Self-pay | Admitting: Internal Medicine

## 2012-02-03 NOTE — Telephone Encounter (Signed)
XANAX refill request. Last filled on 08/02/11 qty 90 with 3 refills. Pt last seen by Dr. Darrick Huntsman on 10/21/11. Ok to refill?

## 2012-02-04 ENCOUNTER — Other Ambulatory Visit: Payer: Self-pay | Admitting: Internal Medicine

## 2012-02-14 ENCOUNTER — Other Ambulatory Visit: Payer: Self-pay | Admitting: Internal Medicine

## 2012-03-20 ENCOUNTER — Other Ambulatory Visit: Payer: Self-pay | Admitting: Internal Medicine

## 2012-04-10 ENCOUNTER — Ambulatory Visit: Payer: Self-pay | Admitting: Surgery

## 2012-04-10 LAB — CBC WITH DIFFERENTIAL/PLATELET
Basophil #: 0 10*3/uL (ref 0.0–0.1)
Eosinophil %: 1.7 %
Lymphocyte #: 2.8 10*3/uL (ref 1.0–3.6)
Lymphocyte %: 31.7 %
MCH: 27.2 pg (ref 26.0–34.0)
MCHC: 33.7 g/dL (ref 32.0–36.0)
Monocyte #: 0.6 x10 3/mm (ref 0.2–0.9)
Monocyte %: 6.8 %
Neutrophil #: 5.2 10*3/uL (ref 1.4–6.5)
Neutrophil %: 59.6 %
Platelet: 148 10*3/uL — ABNORMAL LOW (ref 150–440)
RBC: 5.25 10*6/uL — ABNORMAL HIGH (ref 3.80–5.20)

## 2012-04-10 LAB — BASIC METABOLIC PANEL
Anion Gap: 7 (ref 7–16)
BUN: 18 mg/dL (ref 7–18)
Calcium, Total: 9.3 mg/dL (ref 8.5–10.1)
Chloride: 102 mmol/L (ref 98–107)
Glucose: 80 mg/dL (ref 65–99)
Potassium: 3.9 mmol/L (ref 3.5–5.1)

## 2012-04-10 LAB — HEPATIC FUNCTION PANEL A (ARMC)
Alkaline Phosphatase: 103 U/L (ref 50–136)
Bilirubin, Direct: 0.1 mg/dL (ref 0.00–0.20)
Bilirubin,Total: 0.4 mg/dL (ref 0.2–1.0)
SGOT(AST): 31 U/L (ref 15–37)
SGPT (ALT): 57 U/L (ref 12–78)
Total Protein: 7.6 g/dL (ref 6.4–8.2)

## 2012-04-14 ENCOUNTER — Ambulatory Visit: Payer: Self-pay | Admitting: Surgery

## 2012-04-14 HISTORY — PX: CHOLECYSTECTOMY: SHX55

## 2012-04-17 LAB — PATHOLOGY REPORT

## 2012-05-01 ENCOUNTER — Telehealth: Payer: Self-pay

## 2012-05-01 NOTE — Telephone Encounter (Signed)
[facility] sent a letter to pt about setting up Diagnostic mammo. They found something on her last mammo and they are wanting to watch this spot. Pt was wanting to know if an order could be put in for this.

## 2012-05-01 NOTE — Telephone Encounter (Signed)
This pt has not been seen by me since 2012! She needs a follow up visit prior to ordering any tests.

## 2012-05-03 NOTE — Telephone Encounter (Signed)
Patient has been scheduled for March 7th at 130

## 2012-05-19 ENCOUNTER — Ambulatory Visit: Payer: Medicare Other | Admitting: Internal Medicine

## 2012-05-25 ENCOUNTER — Ambulatory Visit: Payer: Medicare Other | Admitting: Internal Medicine

## 2012-06-02 ENCOUNTER — Telehealth: Payer: Self-pay | Admitting: Internal Medicine

## 2012-06-02 ENCOUNTER — Ambulatory Visit: Payer: Medicare Other | Admitting: Internal Medicine

## 2012-06-02 NOTE — Telephone Encounter (Signed)
Tried  Call pt to r/s appointment

## 2012-06-02 NOTE — Telephone Encounter (Signed)
Spoke with mom at home number because mom was not on designated party release form i didn't tell her anything about appointment.    Call left message on cell asking pt to call office

## 2012-06-02 NOTE — Telephone Encounter (Signed)
LEFT MESSAGE ON BOTH CELL AND HOME NUMBER ASKING PT TO CALL OFFIC.E PLEASE R/S HER APPOINTMENT FROM DR WALKERS SCHEDULE TODAY  TO Tuesday 3/25 @ 1:30

## 2012-06-06 ENCOUNTER — Encounter: Payer: Self-pay | Admitting: Internal Medicine

## 2012-06-06 ENCOUNTER — Ambulatory Visit (INDEPENDENT_AMBULATORY_CARE_PROVIDER_SITE_OTHER): Payer: Medicare Other | Admitting: Internal Medicine

## 2012-06-06 VITALS — BP 130/91 | HR 65 | Temp 99.1°F | Ht 65.0 in | Wt 229.0 lb

## 2012-06-06 DIAGNOSIS — E669 Obesity, unspecified: Secondary | ICD-10-CM

## 2012-06-06 DIAGNOSIS — Z0000 Encounter for general adult medical examination without abnormal findings: Secondary | ICD-10-CM

## 2012-06-06 DIAGNOSIS — I10 Essential (primary) hypertension: Secondary | ICD-10-CM

## 2012-06-06 DIAGNOSIS — E785 Hyperlipidemia, unspecified: Secondary | ICD-10-CM

## 2012-06-06 DIAGNOSIS — K219 Gastro-esophageal reflux disease without esophagitis: Secondary | ICD-10-CM

## 2012-06-06 DIAGNOSIS — F411 Generalized anxiety disorder: Secondary | ICD-10-CM | POA: Insufficient documentation

## 2012-06-06 DIAGNOSIS — Z1211 Encounter for screening for malignant neoplasm of colon: Secondary | ICD-10-CM | POA: Insufficient documentation

## 2012-06-06 DIAGNOSIS — M170 Bilateral primary osteoarthritis of knee: Secondary | ICD-10-CM | POA: Insufficient documentation

## 2012-06-06 DIAGNOSIS — M1710 Unilateral primary osteoarthritis, unspecified knee: Secondary | ICD-10-CM

## 2012-06-06 LAB — COMPREHENSIVE METABOLIC PANEL
ALT: 38 U/L — ABNORMAL HIGH (ref 0–35)
AST: 23 U/L (ref 0–37)
Calcium: 9.2 mg/dL (ref 8.4–10.5)
Chloride: 104 mEq/L (ref 96–112)
Creatinine, Ser: 0.8 mg/dL (ref 0.4–1.2)
Potassium: 4.1 mEq/L (ref 3.5–5.1)

## 2012-06-06 LAB — CBC WITH DIFFERENTIAL/PLATELET
Basophils Absolute: 0 10*3/uL (ref 0.0–0.1)
Eosinophils Absolute: 0.2 10*3/uL (ref 0.0–0.7)
Lymphocytes Relative: 31.6 % (ref 12.0–46.0)
MCHC: 33.8 g/dL (ref 30.0–36.0)
Neutro Abs: 4.1 10*3/uL (ref 1.4–7.7)
Neutrophils Relative %: 59.4 % (ref 43.0–77.0)
Platelets: 173 10*3/uL (ref 150.0–400.0)
RDW: 13.9 % (ref 11.5–14.6)

## 2012-06-06 LAB — LIPID PANEL
HDL: 45.9 mg/dL (ref >39.00)
Total CHOL/HDL Ratio: 5
Triglycerides: 295 mg/dL — ABNORMAL HIGH (ref 0.0–149.0)

## 2012-06-06 LAB — TSH: TSH: 2.36 u[IU]/mL (ref 0.35–5.50)

## 2012-06-06 MED ORDER — ESOMEPRAZOLE MAGNESIUM 40 MG PO CPDR: 40.0000 mg | DELAYED_RELEASE_CAPSULE | Freq: Every day | ORAL | Status: DC

## 2012-06-06 MED ORDER — MELOXICAM 15 MG PO TABS: 15.0000 mg | ORAL_TABLET | Freq: Every day | ORAL | Status: DC

## 2012-06-06 MED ORDER — ESOMEPRAZOLE MAGNESIUM 40 MG PO CPDR: 40.0000 mg | DELAYED_RELEASE_CAPSULE | Freq: Two times a day (BID) | ORAL | Status: DC

## 2012-06-06 MED ORDER — METOPROLOL SUCCINATE ER 50 MG PO TB24: 50.0000 mg | ORAL_TABLET | Freq: Every day | ORAL | Status: DC

## 2012-06-06 MED ORDER — SIMVASTATIN 40 MG PO TABS: 40.0000 mg | ORAL_TABLET | Freq: Every day | ORAL | Status: DC

## 2012-06-06 NOTE — Assessment & Plan Note (Signed)
Will check lipids and LFTs  with labs today. Continue Simvastatin. 

## 2012-06-06 NOTE — Assessment & Plan Note (Signed)
Symptoms well controlled, however pt has been taking Nexium 40mg  twice daily. Encouraged her to try to cut back to 40mg  once daily to see if symptoms continue to be well controlled.

## 2012-06-06 NOTE — Progress Notes (Signed)
Subjective:    Patient ID: Angel Moss, female    DOB: 26-Sep-1960, 52 y.o.   MRN: [id number]  HPI 52YO female with h/o obesity, anxiety, GERD with hiatal hernia, tobacco abuse, presents for follow up. She has not been seen for general follow up in over 2 years. Reports she is doing well. Notes recent weight gain. Attributes to being sedentary over the winter months and to dietary indiscretion.   In regards to GERD, symptoms well controlled with use of Nexium, however taking twice daily.  In regards to hypertension and hyperlipidemia, reports compliance with medication. No recent chest pain, palpitations, headache.  In regards to obesity, trying to limit caloric intake, but notes some indiscretion. Sedentary.  In regards to anxiety, followed by psychiatry. Symptoms controlled with Sertraline and Clonazepam. She requests refill on Alprazolam today because her psychiatrist will not refill this medication.  She is also concerned today about bilateral knee pain. Pain described as aching. No weakness in legs. No swelling noted. Pain worsened with activity and improved with rest and use of Indomethacin (she has been using her mother's supply).  Outpatient Encounter Prescriptions as of 06/06/2012  Medication Sig Dispense Refill  . Biotin 5000 MCG TABS Take 3 tablets by mouth daily.        . clonazePAM (KLONOPIN) 1 MG tablet Take 1 mg by mouth 2 (two) times daily as needed.        . fluticasone (FLOVENT HFA) 110 MCG/ACT inhaler Inhale 2 puffs into the lungs 2 (two) times daily.        Marland Kitchen nystatin (NYSTOP) 100000 UNIT/GM POWD Apply 1 g (100,000 Units total) topically 2 (two) times daily.  60 g  3  . sertraline (ZOLOFT) 100 MG tablet Take 100 mg by mouth 2 (two) times daily.       . VENTOLIN HFA 108 (90 BASE) MCG/ACT inhaler Inhale 2 puffs into the lungs as needed.       . vitamin B-12 (CYANOCOBALAMIN) 500 MCG tablet Take 500 mcg by mouth daily.        . [DISCONTINUED] metoprolol succinate (TOPROL-XL)  50 MG 24 hr tablet TAKE ONE TABLET BY MOUTH EVERY DAY  90 tablet  0  . [DISCONTINUED] NEXIUM 40 MG capsule TAKE ONE CAPSULE BY MOUTH TWICE DAILY  180 capsule  0  . [DISCONTINUED] simvastatin (ZOCOR) 40 MG tablet TAKE ONE TABLET BY MOUTH IN THE EVENING  90 tablet  5  . Cholecalciferol (VITAMIN D3) 5000 UNITS TABS Take 1 tablet by mouth daily.        Marland Kitchen esomeprazole (NEXIUM) 40 MG capsule Take 1 capsule (40 mg total) by mouth 2 (two) times daily.  180 capsule  3  . Flaxseed, Linseed, (FLAX SEED OIL) 1000 MG CAPS Take by mouth. 2 tablets in am and 2 in the pm        . meloxicam (MOBIC) 15 MG tablet Take 1 tablet (15 mg total) by mouth daily.  30 tablet  0  . metoprolol succinate (TOPROL-XL) 50 MG 24 hr tablet Take 1 tablet (50 mg total) by mouth daily. Take with or immediately following a meal.  90 tablet  3  . simvastatin (ZOCOR) 40 MG tablet Take 1 tablet (40 mg total) by mouth at bedtime.  90 tablet  3  . [DISCONTINUED] ALPRAZolam (XANAX) 0.5 MG tablet TAKE ONE TABLET BY MOUTH THREE TIMES DAILY AS NEEDED FOR SLEEP OR ANXIETY  90 tablet  3  . [DISCONTINUED] esomeprazole (NEXIUM) 40 MG capsule Take 1  capsule (40 mg total) by mouth daily.  90 capsule  3   No facility-administered encounter medications on file as of 06/06/2012.   BP 130/91  Pulse 65  Temp(Src) 99.1 F (37.3 C) (Oral)  Ht 5\' 5"  (1.651 m)  Wt 229 lb (103.874 kg)  BMI 38.11 kg/m2  SpO2 95%  Review of Systems  Constitutional: Negative for fever, chills, appetite change, fatigue and unexpected weight change.  HENT: Negative for ear pain, congestion, sore throat, trouble swallowing, neck pain, voice change and sinus pressure.   Eyes: Negative for visual disturbance.  Respiratory: Negative for cough, shortness of breath, wheezing and stridor.   Cardiovascular: Negative for chest pain, palpitations and leg swelling.  Gastrointestinal: Negative for nausea, vomiting, abdominal pain, diarrhea, constipation, blood in stool, abdominal  distention and anal bleeding.  Genitourinary: Negative for dysuria and flank pain.  Musculoskeletal: Positive for myalgias and arthralgias. Negative for gait problem.  Skin: Negative for color change and rash.  Neurological: Negative for dizziness and headaches.  Hematological: Negative for adenopathy. Does not bruise/bleed easily.  Psychiatric/Behavioral: Negative for suicidal ideas, sleep disturbance and dysphoric mood. The patient is not nervous/anxious.        Objective:   Physical Exam  Constitutional: She is oriented to person, place, and time. She appears well-developed and well-nourished. No distress.  HENT:  Head: Normocephalic and atraumatic.  Right Ear: External ear normal.  Left Ear: External ear normal.  Nose: Nose normal.  Mouth/Throat: Oropharynx is clear and moist. No oropharyngeal exudate.  Eyes: Conjunctivae are normal. Pupils are equal, round, and reactive to light. Right eye exhibits no discharge. Left eye exhibits no discharge. No scleral icterus.  Neck: Normal range of motion. Neck supple. No tracheal deviation present. No thyromegaly present.  Cardiovascular: Normal rate, regular rhythm, normal heart sounds and intact distal pulses.  Exam reveals no gallop and no friction rub.   No murmur heard. Pulmonary/Chest: Effort normal and breath sounds normal. No respiratory distress. She has no wheezes. She has no rales. She exhibits no tenderness.  Musculoskeletal: Normal range of motion. She exhibits no edema and no tenderness.       Right knee: She exhibits normal range of motion, no swelling and no effusion.       Left knee: She exhibits normal range of motion, no swelling and no effusion.  Bilateral knee crepitus on exam.  Lymphadenopathy:    She has no cervical adenopathy.  Neurological: She is alert and oriented to person, place, and time. No cranial nerve deficit. She exhibits normal muscle tone. Coordination normal.  Skin: Skin is warm and dry. No rash noted.  She is not diaphoretic. No erythema. No pallor.  Psychiatric: She has a normal mood and affect. Her behavior is normal. Judgment and thought content normal.          Assessment & Plan:

## 2012-06-06 NOTE — Assessment & Plan Note (Signed)
Exam consistent with bilateral knee osteoarthritis. Will try starting Meloxicam to help with symptoms. If no improvement, will set up orthopedic referral. Encouraged weight loss.

## 2012-06-06 NOTE — Assessment & Plan Note (Addendum)
Wt Readings from Last 3 Encounters:  06/06/12 229 lb (103.874 kg)  10/21/11 212 lb 8 oz (96.389 kg)  01/22/11 208 lb (94.348 kg)   20lb weight gain over last 5 months. Encouraged healthier diet and increased physical activity.

## 2012-06-06 NOTE — Assessment & Plan Note (Signed)
BP Readings from Last 3 Encounters:  06/06/12 130/91  10/21/11 114/68  01/22/11 130/70   BP generally well controlled on current medications. Will check renal function with labs. Continue current medications.

## 2012-06-06 NOTE — Assessment & Plan Note (Signed)
Continue sertraline and clonazepam per psychiatrist. Discussed risk of using clonazepam and alprazolam at same time. She will need to fill psych meds with psychiatrist only.  Over time spent in face to face contact with patient discussing overall plan of care for multiple ongoing medical issues.

## 2012-06-07 NOTE — Telephone Encounter (Signed)
Pt came to appointment 3/25

## 2012-06-08 ENCOUNTER — Telehealth: Payer: Self-pay | Admitting: *Deleted

## 2012-06-08 NOTE — Telephone Encounter (Signed)
Message copied by Theola Sequin on Thu Jun 08, 2012  9:23 AM ------      Message from: Ronna Polio A      Created: Wed Jun 07, 2012 10:57 AM       Lab show that cholesterol is elevated. Has pt been taking her medications? ------

## 2012-06-08 NOTE — Telephone Encounter (Signed)
Has she ever tried Crestor or Lipitor?

## 2012-06-08 NOTE — Telephone Encounter (Signed)
Patient stated she has been taking her medication everyday and would like to know what does she need to do?

## 2012-06-12 MED ORDER — ROSUVASTATIN CALCIUM 10 MG PO TABS: 10.0000 mg | ORAL_TABLET | Freq: Every day | ORAL | Status: DC

## 2012-06-12 NOTE — Telephone Encounter (Signed)
She has tried Lipitor but could not tolerate it.

## 2012-06-12 NOTE — Telephone Encounter (Signed)
Lets have her STOP Simvastatin and Start Crestor 10mg  daily. #30 with 3 refills. Will need repeat CMP and lipids in 1 month.

## 2012-06-12 NOTE — Telephone Encounter (Signed)
Rx sent and patient is aware. She will call back to schedule a lab appointment.

## 2012-06-21 ENCOUNTER — Telehealth: Payer: Self-pay | Admitting: Emergency Medicine

## 2012-06-21 DIAGNOSIS — Z1239 Encounter for other screening for malignant neoplasm of breast: Secondary | ICD-10-CM

## 2012-06-21 NOTE — Telephone Encounter (Signed)
Patient called stating she was in need of a mammogram. Can you place an order for me. Thanks

## 2012-07-04 ENCOUNTER — Encounter: Payer: Medicare Other | Admitting: Internal Medicine

## 2012-07-13 ENCOUNTER — Telehealth: Payer: Self-pay | Admitting: Internal Medicine

## 2012-07-13 DIAGNOSIS — E785 Hyperlipidemia, unspecified: Secondary | ICD-10-CM

## 2012-07-13 DIAGNOSIS — B379 Candidiasis, unspecified: Secondary | ICD-10-CM

## 2012-07-13 NOTE — Telephone Encounter (Signed)
She should NOT double dose of Simvastatin.  She should continue Simvastatin 40mg  daily. Fine to refill the Simvastatin 40mg  daily #90 with 3 refills.

## 2012-07-13 NOTE — Telephone Encounter (Signed)
Ok to refill 

## 2012-07-13 NOTE — Telephone Encounter (Signed)
LMTCB

## 2012-07-13 NOTE — Telephone Encounter (Signed)
Patient doubled up on her simvastatin 1 in the morning and 1 in the evening , because she could not afford her crestor.  simvastatin (ZOCOR) 40 MG tablet  Patient will not take Lipitor   nystatin (NYSTOP) 100000 UNIT/GM POWD

## 2012-07-14 MED ORDER — NYSTATIN 100000 UNIT/GM EX POWD: 1.0000 g | Freq: Two times a day (BID) | CUTANEOUS | Status: DC

## 2012-07-14 MED ORDER — SIMVASTATIN 40 MG PO TABS: 40.0000 mg | ORAL_TABLET | Freq: Every day | ORAL | Status: DC

## 2012-07-14 NOTE — Telephone Encounter (Signed)
Spoke with patient and she state she will only take 1 tablet daily and not double up anymore. Rx sent to pharmacy.

## 2012-07-20 ENCOUNTER — Ambulatory Visit: Payer: Self-pay | Admitting: Internal Medicine

## 2012-07-24 ENCOUNTER — Telehealth: Payer: Self-pay | Admitting: Internal Medicine

## 2012-07-24 ENCOUNTER — Emergency Department: Payer: Self-pay | Admitting: Emergency Medicine

## 2012-07-24 DIAGNOSIS — M170 Bilateral primary osteoarthritis of knee: Secondary | ICD-10-CM

## 2012-07-24 MED ORDER — MELOXICAM 15 MG PO TABS: 15.0000 mg | ORAL_TABLET | Freq: Every day | ORAL | Status: DC

## 2012-07-24 NOTE — Telephone Encounter (Signed)
meloxicam (MOBIC) 15 MG tablet

## 2012-07-24 NOTE — Telephone Encounter (Signed)
This pt should be seen immediately. If no openings, then urgent care or ED. Symptoms are concerning for [facility] Spotted Fever.

## 2012-07-24 NOTE — Telephone Encounter (Signed)
Spoke with patient on a previous encounter, patient has been instructed to go to the ED. Patient verbally agreed she would go.

## 2012-07-24 NOTE — Telephone Encounter (Signed)
Patient informed to go to the ED and she verbally agreed to go. Rx for Mobic sent to pharmacy

## 2012-07-24 NOTE — Telephone Encounter (Signed)
Patient Information:  Caller Name: Baylyn  Phone: [phone]  Patient: Lexxi, Koslow  Gender: Female  DOB: 1961/02/20  Age: 52 Years  PCP: Ronna Polio (Adults only)  Pregnant: No  Office Follow Up:  Does the office need to follow up with this patient?: Yes  Instructions For The Office: Workin appt, as appts not available within dispositioned time frame krs/can  RN Note:  States has had headache, weakness.  States headache has been intermittent.  Per tick bite protocol, advised appt in office; no appts available within dispositioned time frame.  Info to office for staff/provider review/workin appt.  May reach patient [phone].  krs/can  Symptoms  Reason For Call & Symptoms: tick bite  Reviewed Health History In EMR: Yes  Reviewed Medications In EMR: Yes  Reviewed Allergies In EMR: Yes  Reviewed Surgeries / Procedures: Yes  Date of Onset of Symptoms: 07/20/2012 OB / GYN:  LMP: Unknown  Guideline(s) Used:  Tick Bite  Disposition Per Guideline:   Go to Office Now  Reason For Disposition Reached:   Fever or severe headache occurs, 2 to 14 days following the bite  Advice Given:  N/A  Patient Will Follow Care Advice:  YES

## 2012-07-24 NOTE — Telephone Encounter (Signed)
Patient left voicemail stating she was bit some time last week and now she is lethargic, did have a HA but now it has gone. She is having really bad muscle aches and did not get out of the bed for 4 days. She was working out in the yard sitting on the ground when something bit her on her left forearm and then she removed a tick from her butt cheek. Do not see any opening today for you or Raquel.

## 2012-08-03 ENCOUNTER — Ambulatory Visit (INDEPENDENT_AMBULATORY_CARE_PROVIDER_SITE_OTHER): Payer: Medicare Other | Admitting: Internal Medicine

## 2012-08-03 ENCOUNTER — Encounter: Payer: Self-pay | Admitting: Internal Medicine

## 2012-08-03 VITALS — BP 122/78 | HR 64 | Temp 98.3°F | Wt 235.0 lb

## 2012-08-03 DIAGNOSIS — E785 Hyperlipidemia, unspecified: Secondary | ICD-10-CM

## 2012-08-03 DIAGNOSIS — M25561 Pain in right knee: Secondary | ICD-10-CM | POA: Insufficient documentation

## 2012-08-03 DIAGNOSIS — E669 Obesity, unspecified: Secondary | ICD-10-CM

## 2012-08-03 DIAGNOSIS — M25569 Pain in unspecified knee: Secondary | ICD-10-CM

## 2012-08-03 DIAGNOSIS — I10 Essential (primary) hypertension: Secondary | ICD-10-CM

## 2012-08-03 NOTE — Assessment & Plan Note (Signed)
BP Readings from Last 3 Encounters:  08/03/12 122/78  06/06/12 130/91  10/21/11 114/68   Blood pressure has been well-controlled recently. Will continue metoprolol.

## 2012-08-03 NOTE — Assessment & Plan Note (Signed)
Patient with osteoarthritis of both knees with recent worsening of symptoms in her right knee. Unable to tolerate meloxicam. Prefers not to take medications for pain. Will set up orthopedic evaluation. Question if she might be a candidate for Synvisc. Suspect that she would not do well with cortisone injection given history of significant anxiety.

## 2012-08-03 NOTE — Progress Notes (Signed)
Subjective:    Patient ID: Angel Moss, female    DOB: 01-16-1961, 52 y.o.   MRN: [id number]  HPI 52 year old female with history of obesity, anxiety, hyperlipidemia, hypertension presents for followup. She is concerned about her weight. She continues to gain weight. She notes dietary indiscretion and increased intake of sweetened foods. She is physically active but does not follow any specific exercise program. In the past, she was evaluated by bariatric surgery but she opted not to proceed with this. With the increase in her weight, she notes increased pain in her joints. Over the last few weeks the pain in her right knee has gotten particularly bad in limited her ability to function. She denies swelling or redness over her right knee. She denies any instability in her right leg. She tried taking meloxicam for osteoarthritis in her knees but was not able to tolerate this medication because she felt that a razor blood pressure.  In regards to hyperlipidemia, she reports that she was unable to afford Crestor so resumed simvastatin 40 mg daily.  Outpatient Encounter Prescriptions as of 08/03/2012  Medication Sig Dispense Refill  . Ascorbic Acid (VITAMIN C) 1000 MG tablet Take 1,000 mg by mouth daily.      Marland Kitchen aspirin 81 MG chewable tablet Chew 81 mg by mouth daily.      . Biotin 5000 MCG TABS Take 3 tablets by mouth daily.        . Cholecalciferol (VITAMIN D3) 5000 UNITS TABS Take 1 tablet by mouth daily.        . clonazePAM (KLONOPIN) 1 MG tablet Take 1 mg by mouth 2 (two) times daily as needed.        Marland Kitchen esomeprazole (NEXIUM) 40 MG capsule Take 1 capsule (40 mg total) by mouth 2 (two) times daily.  180 capsule  3  . fluticasone (FLOVENT HFA) 110 MCG/ACT inhaler Inhale 2 puffs into the lungs 2 (two) times daily.        . metoprolol succinate (TOPROL-XL) 50 MG 24 hr tablet Take 1 tablet (50 mg total) by mouth daily. Take with or immediately following a meal.  90 tablet  3  . nystatin  (MYCOSTATIN/NYSTOP) 100000 UNIT/GM POWD Apply 1 g topically 2 (two) times daily.  60 g  3  . sertraline (ZOLOFT) 100 MG tablet Take 100 mg by mouth 2 (two) times daily.       . simvastatin (ZOCOR) 40 MG tablet Take 1 tablet (40 mg total) by mouth at bedtime.  90 tablet  3  . VENTOLIN HFA 108 (90 BASE) MCG/ACT inhaler Inhale 2 puffs into the lungs as needed.       . vitamin B-12 (CYANOCOBALAMIN) 500 MCG tablet Take 500 mcg by mouth daily.        . Flaxseed, Linseed, (FLAX SEED OIL) 1000 MG CAPS Take by mouth. 2 tablets in am and 2 in the pm        . [DISCONTINUED] meloxicam (MOBIC) 15 MG tablet Take 1 tablet (15 mg total) by mouth daily.  30 tablet  0  . [DISCONTINUED] rosuvastatin (CRESTOR) 10 MG tablet Take 1 tablet (10 mg total) by mouth daily.  30 tablet  3   No facility-administered encounter medications on file as of 08/03/2012.   BP 122/78  Pulse 64  Temp(Src) 98.3 F (36.8 C) (Oral)  Wt 235 lb (106.595 kg)  BMI 39.11 kg/m2  SpO2 94%   Review of Systems  Constitutional: Negative for fever, chills, appetite change, fatigue and  unexpected weight change.  HENT: Negative for ear pain, congestion, sore throat, trouble swallowing, neck pain, voice change and sinus pressure.   Eyes: Negative for visual disturbance.  Respiratory: Negative for cough, shortness of breath, wheezing and stridor.   Cardiovascular: Negative for chest pain, palpitations and leg swelling.  Gastrointestinal: Negative for nausea, vomiting, abdominal pain, diarrhea, constipation, blood in stool, abdominal distention and anal bleeding.  Genitourinary: Negative for dysuria and flank pain.  Musculoskeletal: Positive for arthralgias. Negative for myalgias and gait problem.  Skin: Negative for color change and rash.  Neurological: Negative for dizziness and headaches.  Hematological: Negative for adenopathy. Does not bruise/bleed easily.  Psychiatric/Behavioral: Negative for suicidal ideas, sleep disturbance and  dysphoric mood. The patient is not nervous/anxious.        Objective:   Physical Exam  Constitutional: She is oriented to person, place, and time. She appears well-developed and well-nourished. No distress.  HENT:  Head: Normocephalic and atraumatic.  Right Ear: External ear normal.  Left Ear: External ear normal.  Nose: Nose normal.  Mouth/Throat: Oropharynx is clear and moist. No oropharyngeal exudate.  Eyes: Conjunctivae are normal. Pupils are equal, round, and reactive to light. Right eye exhibits no discharge. Left eye exhibits no discharge. No scleral icterus.  Neck: Normal range of motion. Neck supple. No tracheal deviation present. No thyromegaly present.  Cardiovascular: Normal rate, regular rhythm, normal heart sounds and intact distal pulses.  Exam reveals no gallop and no friction rub.   No murmur heard. Pulmonary/Chest: Effort normal and breath sounds normal. No accessory muscle usage. Not tachypneic. No respiratory distress. She has no decreased breath sounds. She has no wheezes. She has no rhonchi. She has no rales. She exhibits no tenderness.  Musculoskeletal: Normal range of motion. She exhibits no edema and no tenderness.       Right knee: She exhibits normal range of motion and no swelling.  Lymphadenopathy:    She has no cervical adenopathy.  Neurological: She is alert and oriented to person, place, and time. No cranial nerve deficit. She exhibits normal muscle tone. Coordination normal.  Skin: Skin is warm and dry. No rash noted. She is not diaphoretic. No erythema. No pallor.  Psychiatric: She has a normal mood and affect. Her behavior is normal. Judgment and thought content normal.          Assessment & Plan:

## 2012-08-03 NOTE — Assessment & Plan Note (Signed)
Wt Readings from Last 3 Encounters:  08/03/12 235 lb (106.595 kg)  06/06/12 229 lb (103.874 kg)  10/21/11 212 lb 8 oz (96.389 kg)   Body mass index is 39.11 kg/(m^2). Encouraged more aggressive effort at weight loss. Encouraged patient to keep a food diary and increase protein intake while limiting intake of processed carbohydrates. She has been evaluated for bariatric surgery in the past but decided not to follow through with this. Encourage continued efforts at exercise on a regular basis.

## 2012-08-03 NOTE — Assessment & Plan Note (Signed)
Lipids were recently elevated on labs however patient was unable to afford Crestor. Will continue simvastatin. Encouraged her to follow a healthier diet high in fiber and low in saturated fat.

## 2012-08-17 ENCOUNTER — Encounter: Payer: Self-pay | Admitting: Internal Medicine

## 2012-08-28 ENCOUNTER — Telehealth: Payer: Self-pay | Admitting: Internal Medicine

## 2012-08-28 NOTE — Telephone Encounter (Signed)
Spoke with patient and she state her knee will not work. Thinks she need a second opinion because the surgeon did not do anything for her. Her horrible knee and ankle pain is the reason she feels she has a valid reason for the disability form.

## 2012-08-28 NOTE — Telephone Encounter (Signed)
OKJoice Lofts- Is there another orthopedic doc in her network who we can refer to?

## 2012-08-28 NOTE — Telephone Encounter (Signed)
I can refer her to [facility], but they will want to have all records prior to seeing her b/c she has seen someone else. I'm not sure they are in network with her insurance, but I'll be glad to set her up, just let me know.

## 2012-08-28 NOTE — Telephone Encounter (Signed)
Left message to call back  

## 2012-08-28 NOTE — Telephone Encounter (Signed)
Patient dropped off form for application for renewal of permanent disability parking it is in Dr. Tilman Neat box.  She also wanted Dr. Dan Humphreys to know that she went to se Dr. Katrinka Blazing (ortho) and she states that he did absolutely nothing for her and he told her that there was nothing wrong with her knee. He did do an xray and came back and told her there was a rough spot however didn't offer any shots surgery or anything. She states that her knee is still "killing her".

## 2012-08-28 NOTE — Telephone Encounter (Signed)
OK. Lets try to set her up.

## 2012-08-28 NOTE — Telephone Encounter (Signed)
Patient returned your call.

## 2012-08-28 NOTE — Telephone Encounter (Signed)
Needs an appointment for follow up. Can you clarify with her why she is permanently disabled?

## 2012-08-29 NOTE — Telephone Encounter (Signed)
Patient seen Dr. Katrinka Blazing @ [facility], last week 6/11, pt does not have a follow up. (This appointment was made while I was out of town)

## 2012-09-01 ENCOUNTER — Ambulatory Visit: Payer: Medicare Other | Admitting: Internal Medicine

## 2012-09-01 NOTE — Telephone Encounter (Signed)
Patient was scheduled to come in today to discuss this Dr. Dan Humphreys but cancelled her appointment.

## 2012-09-08 NOTE — Telephone Encounter (Signed)
Handicap form patient left at office, form not completed and mailed back to patient home address on file.

## 2012-10-11 ENCOUNTER — Ambulatory Visit: Payer: Medicare Other | Admitting: Internal Medicine

## 2012-10-27 ENCOUNTER — Telehealth: Payer: Self-pay | Admitting: Internal Medicine

## 2012-10-27 NOTE — Telephone Encounter (Signed)
Pt states she is having some chest pain and anxiety, feels this is related to a problem she is having with her car that is causing stress.  Pt refuses Triage at this time.  Pt does not want to be seen by anyone except Dr. Dan Humphreys.  Pt does not want to go to ER.  No appt has been scheduled at this time.  Pt would like a call.

## 2012-10-27 NOTE — Telephone Encounter (Signed)
She is so stressed out about her car issues and she did throw up a little bit. Chest pain is heavy, a dull achy pain radiating through to her back, not like the elephant sitting on her chest. It will go away then once she began moving around the pain will return. No chest pain right now, not having any tingling or numbness at all. If this happens again she agrees to go to the hospital. Advised patient to go to the ED, patient is still reluctant but will go if pain persist.

## 2012-10-27 NOTE — Telephone Encounter (Signed)
Patient did not answer, left message for her to call the office back but hopefully she has went on to the ED.

## 2012-10-27 NOTE — Telephone Encounter (Signed)
She has multiple risk factors for heart disease. She needs to go to the ED for evaluation immediately.

## 2012-10-30 ENCOUNTER — Ambulatory Visit (INDEPENDENT_AMBULATORY_CARE_PROVIDER_SITE_OTHER): Payer: Medicare Other | Admitting: Adult Health

## 2012-10-30 ENCOUNTER — Encounter: Payer: Self-pay | Admitting: Adult Health

## 2012-10-30 VITALS — BP 132/90 | HR 63 | Temp 98.4°F | Resp 14 | Wt 230.0 lb

## 2012-10-30 DIAGNOSIS — R5383 Other fatigue: Secondary | ICD-10-CM

## 2012-10-30 DIAGNOSIS — R5381 Other malaise: Secondary | ICD-10-CM

## 2012-10-30 DIAGNOSIS — R079 Chest pain, unspecified: Secondary | ICD-10-CM

## 2012-10-30 LAB — BASIC METABOLIC PANEL
CO2: 28 mEq/L (ref 19–32)
Chloride: 103 mEq/L (ref 96–112)
Creatinine, Ser: 0.9 mg/dL (ref 0.4–1.2)
Potassium: 4.1 mEq/L (ref 3.5–5.1)

## 2012-10-30 LAB — TROPONIN I: Troponin I: <0.01 ng/mL (ref <0.06)

## 2012-10-30 NOTE — Telephone Encounter (Signed)
Patient did not go to the ED, she waited to be seen today in clinic

## 2012-10-30 NOTE — Progress Notes (Signed)
Subjective:    Patient ID: Angel Moss, female    DOB: 09/11/1960, 53 y.o.   MRN: [id number]  HPI  Patient is a pleasant 52 year old female with history of hypertension, hyperlipidemia, obesity, ongoing tobacco abuse, GERD, anxiety who presents to clinic with c/o intermittent chest pain x 2 weeks. Pain sometimes radiates to her back. Pain worse with activity. Right hand tingling associated with CP. She has had some n/v & diaphoresis associated with cp. Patient reports shortness of breath "all the time" but worse with CP. She feels lightheaded when the episodes begin but denies syncope. She is also feeling fatigued. She was asked to go to the ED but she refused. Patient reports increased stress recently and feels correlation with chest pain.   Current Outpatient Prescriptions on File Prior to Visit  Medication Sig Dispense Refill  . Ascorbic Acid (VITAMIN C) 1000 MG tablet Take 1,000 mg by mouth daily.      Marland Kitchen aspirin 81 MG chewable tablet Chew 81 mg by mouth daily.      . Biotin 5000 MCG TABS Take 3 tablets by mouth daily.        . Cholecalciferol (VITAMIN D3) 5000 UNITS TABS Take 1 tablet by mouth daily.        . clonazePAM (KLONOPIN) 1 MG tablet Take 1 mg by mouth 2 (two) times daily as needed.        Marland Kitchen esomeprazole (NEXIUM) 40 MG capsule Take 1 capsule (40 mg total) by mouth 2 (two) times daily.  180 capsule  3  . Flaxseed, Linseed, (FLAX SEED OIL) 1000 MG CAPS Take by mouth. 2 tablets in am and 2 in the pm        . fluticasone (FLOVENT HFA) 110 MCG/ACT inhaler Inhale 2 puffs into the lungs 2 (two) times daily.        . metoprolol succinate (TOPROL-XL) 50 MG 24 hr tablet Take 1 tablet (50 mg total) by mouth daily. Take with or immediately following a meal.  90 tablet  3  . nystatin (MYCOSTATIN/NYSTOP) 100000 UNIT/GM POWD Apply 1 g topically 2 (two) times daily.  60 g  3  . sertraline (ZOLOFT) 100 MG tablet Take 100 mg by mouth 2 (two) times daily.       . simvastatin (ZOCOR) 40 MG tablet  Take 1 tablet (40 mg total) by mouth at bedtime.  90 tablet  3  . VENTOLIN HFA 108 (90 BASE) MCG/ACT inhaler Inhale 2 puffs into the lungs as needed.       . vitamin B-12 (CYANOCOBALAMIN) 500 MCG tablet Take 500 mcg by mouth daily.         No current facility-administered medications on file prior to visit.     Review of Systems  Constitutional: Positive for diaphoresis.  Respiratory: Positive for chest tightness, shortness of breath and wheezing. Negative for cough.   Cardiovascular: Positive for chest pain.  Gastrointestinal: Positive for nausea and vomiting.  Neurological: Positive for light-headedness. Negative for dizziness and syncope.  Psychiatric/Behavioral: Negative.    BP 132/90  Pulse 63  Temp(Src) 98.4 F (36.9 C) (Oral)  Resp 14  Wt 230 lb (104.327 kg)  BMI 38.27 kg/m2  SpO2 97%    Objective:   Physical Exam  Constitutional: She is oriented to person, place, and time.  Overweight, pleasant female in NAD  HENT:  Head: Normocephalic and atraumatic.  Cardiovascular: Normal rate, regular rhythm, normal heart sounds and intact distal pulses.  Exam reveals no gallop and no friction  rub.   No murmur heard. Pulmonary/Chest: Effort normal and breath sounds normal. No respiratory distress. She has no wheezes. She has no rales. She exhibits no tenderness.  Abdominal: Soft. Bowel sounds are normal.  Musculoskeletal: Normal range of motion.  Neurological: She is alert and oriented to person, place, and time.  Skin: Skin is warm and dry.  Psychiatric: She has a normal mood and affect. Her behavior is normal. Judgment and thought content normal.      Assessment & Plan:

## 2012-10-30 NOTE — Patient Instructions (Signed)
  Please have your blood work done prior to leaving the office.  I am referring you to Cardiology for evaluation of your chest pain.  If you develop chest pain, shortness of breath please go to the ED.

## 2012-10-30 NOTE — Assessment & Plan Note (Signed)
Ongoing intermittent chest pain x2 weeks. EKG done in the office and shows sinus rhythm. Lead aVF with possible bundle branch block. Refer to cardiology. I have advised patient to go to the ED if she develops chest pain with or without radiation, shortness of breath, n/v. Patient agreed. Check labs: bmet, tsh, troponin, cpk

## 2012-10-31 LAB — TSH: TSH: 2.07 u[IU]/mL (ref 0.35–5.50)

## 2012-10-31 LAB — CK TOTAL AND CKMB (NOT AT ARMC)
CK, MB: 1.6 ng/mL (ref 0.3–4.0)
Total CK: 54 U/L (ref 7–177)

## 2012-11-07 ENCOUNTER — Ambulatory Visit (INDEPENDENT_AMBULATORY_CARE_PROVIDER_SITE_OTHER): Payer: Medicare Other | Admitting: Cardiovascular Disease

## 2012-11-07 ENCOUNTER — Encounter: Payer: Self-pay | Admitting: Cardiovascular Disease

## 2012-11-07 VITALS — BP 120/84 | HR 64 | Ht 66.0 in | Wt 227.8 lb

## 2012-11-07 DIAGNOSIS — R079 Chest pain, unspecified: Secondary | ICD-10-CM

## 2012-11-07 DIAGNOSIS — F17200 Nicotine dependence, unspecified, uncomplicated: Secondary | ICD-10-CM

## 2012-11-07 DIAGNOSIS — E785 Hyperlipidemia, unspecified: Secondary | ICD-10-CM

## 2012-11-07 DIAGNOSIS — R0602 Shortness of breath: Secondary | ICD-10-CM

## 2012-11-07 DIAGNOSIS — Z720 Tobacco use: Secondary | ICD-10-CM

## 2012-11-07 NOTE — Assessment & Plan Note (Addendum)
She's had some episodes of exertional shortness of breath and  chest pain/ tightness . It is difficult to know whether this represents coronary artery disease or perhaps chest discomfort due to her for sleep apnea and obesity.  We'll schedule her for a [employer] for further evaluation of her chest discomfort.  Like to do an echocardiogram to evaluate her pulmonary artery pressures and her left adductor systolic function.  We had A long discussion about diet, exercise, and weight loss.  She knows that she needs to cut her sweets.   I have asked her to exercise regularly - 4-5 times a week.     I will see her again in 3 months.

## 2012-11-07 NOTE — Progress Notes (Signed)
Angel Moss Date of Birth  November 28, 1960       [facility] Office [address], [address]  [address], [address] [place], [place]  [address]   [place], [place]  [address] [phone]     [phone]   Fax  [phone]    Fax [phone]  Problem List: 1. Chest pain 2. Sleep apnea 3. Hyperlipidemia    History of Present Illness:  Angel Moss is a 52 yo with onset of CP, severe dyspnea, and sweats.  She has a history of sleep apnea but does not wear her CPAP because she is claustrophobic.  These episodes of pain started about 2 months ago. These episodes typically occur with exertion the last 3 or 4 minutes. They resolved when she stops to rest.  She also has episodes of occasional  Profuse diaphoresis.   She has had some hot flashes associated with menopause but she is able to differentiate the 2.  She does not follow any specific diet  -she admits to eating a lot of high fat and high carb foods.  She eats out quite a bit.  She eats [employer] Jerries ice cream almost every night ( 1 pint every night)  Current Outpatient Prescriptions on File Prior to Visit  Medication Sig Dispense Refill  . Ascorbic Acid (VITAMIN C) 1000 MG tablet Take 1,000 mg by mouth daily.      Marland Kitchen aspirin 81 MG chewable tablet Chew 81 mg by mouth daily.      . Biotin 5000 MCG TABS Take 3 tablets by mouth daily.        . Cholecalciferol (VITAMIN D3) 5000 UNITS TABS Take 1 tablet by mouth daily.        . clonazePAM (KLONOPIN) 1 MG tablet Take 1 mg by mouth 2 (two) times daily as needed.        Marland Kitchen esomeprazole (NEXIUM) 40 MG capsule Take 1 capsule (40 mg total) by mouth 2 (two) times daily.  180 capsule  3  . fluticasone (FLOVENT HFA) 110 MCG/ACT inhaler Inhale 2 puffs into the lungs 2 (two) times daily.        . metoprolol succinate (TOPROL-XL) 50 MG 24 hr tablet Take 1 tablet (50 mg total) by mouth daily. Take with or immediately following a meal.  90 tablet  3  . nystatin  (MYCOSTATIN/NYSTOP) 100000 UNIT/GM POWD Apply 1 g topically 2 (two) times daily.  60 g  3  . sertraline (ZOLOFT) 100 MG tablet Take 100 mg by mouth 2 (two) times daily.       . simvastatin (ZOCOR) 40 MG tablet Take 1 tablet (40 mg total) by mouth at bedtime.  90 tablet  3  . VENTOLIN HFA 108 (90 BASE) MCG/ACT inhaler Inhale 2 puffs into the lungs as needed.        No current facility-administered medications on file prior to visit.    No Known Allergies  Past Medical History  Diagnosis Date  . Obesity   . Anxiety   . Mitral valve prolapse   . Sleep apnea   . History of kidney stones   . Hyperlipidemia   . Cervical cancer     Past Surgical History  Procedure Laterality Date  . Cholecystectomy    . Kidney stone surgery    . Vaginal hysterectomy    . Colonoscopy      History  Smoking status  . Current Every Day Smoker -- 0.25 packs/day  for 10 years  . Types: Cigarettes  Smokeless tobacco  . Never Used    History  Alcohol Use No    Family History  Problem Relation Age of Onset  . Hypertension Mother   . Heart disease Father   . Cancer Paternal Grandmother     breast    Reviw of Systems:  Reviewed in the HPI.  All other systems are negative.  Physical Exam: Blood pressure 120/84, pulse 64, height 5\' 6"  (1.676 m), weight 227 lb 12 oz (103.307 kg). General: Well developed, well nourished, in no acute distress.  Head: Normocephalic, atraumatic, sclera non-icteric, mucus membranes are moist,   Neck: Supple. Carotids are 2 + without bruits. No JVD   Lungs: Clear   Heart: RR, normal S1, S2, no murmurs  Abdomen: Soft, non-tender, non-distended with normal bowel sounds.  Msk:  Strength and tone are normal   Extremities: No clubbing or cyanosis. No edema.  Distal pedal pulses are 2+ and equal    Neuro: CN II - XII intact.  Alert and oriented X 3.   Psych:  Normal   ECG: November 07, 2012:  NSR at 67.  No St or T wave changes.  Assessment / Plan:

## 2012-11-07 NOTE — Assessment & Plan Note (Signed)
Her lipids are elevated.   Continue his simvastatin for now. She was started on Crestor but did not want to pay for that expense of the medication. We may be limited her atorvastatin and lower parts. She is convinced me that she is at work on low carbohydrate low-fat diet and she hopes to lose 30 pounds in the next several months. We'll recheck her lipid levels at that point.

## 2012-11-07 NOTE — Patient Instructions (Addendum)
[facility] Low Glycemic Diet (Source: [employer], 2006) Low Glycemic Foods (20-49) (Decrease risk of developing heart disease) Breakfast Cereals: All-Bran All-Bran Fruit 'n Oats Fiber One Oatmeal (not instant) Oat bran Fruits and fruit juices: (Limit to 1-2 servings per day) Apples Apricots (fresh & dried) Blackberries Blueberries Cherries Cranberries Peaches Pears Plums Prunes Grapefruit Raspberries Strawberries Tangerine Apple juice Grapefruit juice Tomato juice Beans and legumes (fresh-cooked): Black-eyed peas Butter beans Chick peas Lentils  Green beans Lima beans Kidney beans Navy beans Pinto beans Snow peas Non-starchy vegetables: Asparagus, avocado, broccoli, cabbage, cauliflower, celery, cucumber, greens, lettuce, mushrooms, peppers, tomatoes, okra, onions, spinach, summer squash Grains: Barley Bulgur Rye Wild rice Nuts and oils : Almonds Peanuts Sunflower seeds Hazelnuts Pecans Walnuts Oils that are liquid at room temperature Dairy, fish, meat, soy, and eggs: Milk, skim Lowfat cheese Yogurt, lowfat, fruit sugar sweetened Lean red meat Fish  Skinless chicken & [place] Shellfish Egg whites (up to 3 daily) Soy products  Egg yolks (up to 7 or _____ per week) Moderate Glycemic Foods (50-69) Breakfast Cereals: Bran Buds Bran Chex Just Right Mini-Wheats  Special K Swiss muesli Fruits: Banana (under-ripe) Dates Figs Grapes Kiwi Mango Oranges Raisins Fruit Juices: Cranberry juice Orange juice Beans and legumes: Boston-type baked beans Canned pinto, kidney, or navy beans Green peas Vegetables: Beets Carrots  Sweet potato Yam Corn on the cob Breads: Pita (pocket) bread Oat bran bread Pumpernickel bread Rye bread Wheat bread, high fiber  Grains: Cornmeal Rice, brown Rice, white Couscous Pasta: Macaroni Pizza, cheese Ravioli, meat filled Spaghetti, white  Nuts: Cashews Macadamia Snacks: Chocolate Ice cream, lowfat Muffin  Popcorn High Glycemic Foods (70-100)  Breakfast Cereals: Cheerios Corn Chex Corn Flakes Cream of Wheat Grape Nuts Grape Nut Flakes Grits Nutri-Grain Puffed Rice Puffed Wheat Rice Chex Rice Krispies Shredded Wheat Team Total Fruits: Pineapple Watermelon Banana (over-ripe) Beverages: Sodas, sweet tea, pineapple juice Vegetables: Potato, baked, boiled, fried, mashed [place] fries Canned or frozen corn Parsnips Winter squash Breads: Most breads (white and whole grain) Bagels Bread sticks Bread stuffing Kaiser roll Dinner rolls Grains: Rice, instant Tapioca, with milk Candy and most cookies Snacks: Donuts Corn chips Jelly beans Pretzels Pastries   Schedule Echo to evaluate for pulmonary hypertension.  Follow-up in 3 months.  [facility] MYOVIEW  Your caregiver has ordered a Stress Test with nuclear imaging. The purpose of this test is to evaluate the blood supply to your heart muscle. This procedure is referred to as a "Non-Invasive Stress Test." This is because other than having an IV started in your vein, nothing is inserted or "invades" your body. Cardiac stress tests are done to find areas of poor blood flow to the heart by determining the extent of coronary artery disease (CAD). Some patients exercise on a treadmill, which naturally increases the blood flow to your heart, while others who are  unable to walk on a treadmill due to physical limitations have a pharmacologic/chemical stress agent called Lexiscan . This medicine will mimic walking on a treadmill by temporarily increasing your coronary blood flow.   Please note: these test may take anywhere between 2-4 hours to complete  PLEASE REPORT TO [facility] ENTR[facility]  THE VOLUNTEERS AT THE FIRST DESK WILL DIRECT YOU WHERE TO GO  Date of Procedure:________08/27/14____________________________  Arrival Time for Procedure:__________7:15am____________________  Instructions regarding medication:   _X___:  Hold  betablocker(s) night before procedure and morning of procedure- Metoprolol   PLEASE NOTIFY THE OFFICE AT LEAST 24 HOURS IN ADVANCE  IF YOU ARE UNABLE TO KEEP YOUR APPOINTMENT.  [phone] AND  PLEASE NOTIFY NUCLEAR MEDICINE AT [facility] AT LEAST 24 HOURS IN ADVANCE IF YOU ARE UNABLE TO KEEP YOUR APPOINTMENT. [phone]  How to prepare for your Myoview test:  1. Do not eat or drink after midnight 2. No caffeine for 24 hours prior to test 3. No smoking 24 hours prior to test. 4. Your medication may be taken with water.  If your doctor stopped a medication because of this test, do not take that medication. 5. Ladies, please do not wear dresses.  Skirts or pants are appropriate. Please wear a short sleeve shirt. 6. No perfume, cologne or lotion. 7. Wear comfortable walking shoes. No heels!

## 2012-11-07 NOTE — Assessment & Plan Note (Signed)
I Spent great deal of time on the dangers of cigarette smoking.  I'm not convinced that she is ready to quite

## 2012-11-08 ENCOUNTER — Other Ambulatory Visit: Payer: Self-pay

## 2012-11-08 ENCOUNTER — Ambulatory Visit: Payer: Self-pay

## 2012-11-08 DIAGNOSIS — Z720 Tobacco use: Secondary | ICD-10-CM

## 2012-11-08 DIAGNOSIS — R0602 Shortness of breath: Secondary | ICD-10-CM

## 2012-11-08 DIAGNOSIS — R079 Chest pain, unspecified: Secondary | ICD-10-CM

## 2012-11-08 DIAGNOSIS — E785 Hyperlipidemia, unspecified: Secondary | ICD-10-CM

## 2012-11-09 ENCOUNTER — Telehealth: Payer: Self-pay

## 2012-11-09 NOTE — Telephone Encounter (Signed)
Pt would like results from stress test. Please call.

## 2012-11-09 NOTE — Telephone Encounter (Signed)
I spoke with the patient. She is aware of her results.  

## 2012-11-10 ENCOUNTER — Telehealth: Payer: Self-pay

## 2012-11-10 NOTE — Telephone Encounter (Signed)
LMTCB Pt needs to schedule echo

## 2012-12-05 ENCOUNTER — Other Ambulatory Visit: Payer: Self-pay

## 2012-12-05 ENCOUNTER — Other Ambulatory Visit (INDEPENDENT_AMBULATORY_CARE_PROVIDER_SITE_OTHER): Payer: Medicare Other

## 2012-12-05 DIAGNOSIS — R079 Chest pain, unspecified: Secondary | ICD-10-CM

## 2012-12-05 DIAGNOSIS — Z720 Tobacco use: Secondary | ICD-10-CM

## 2012-12-05 DIAGNOSIS — R0609 Other forms of dyspnea: Secondary | ICD-10-CM

## 2012-12-05 DIAGNOSIS — E785 Hyperlipidemia, unspecified: Secondary | ICD-10-CM

## 2012-12-05 DIAGNOSIS — R0602 Shortness of breath: Secondary | ICD-10-CM

## 2013-01-25 ENCOUNTER — Ambulatory Visit: Payer: Self-pay | Admitting: Unknown Physician Specialty

## 2013-02-07 ENCOUNTER — Ambulatory Visit: Payer: Medicare Other | Admitting: Internal Medicine

## 2013-02-14 ENCOUNTER — Ambulatory Visit: Payer: Medicare Other | Admitting: Cardiovascular Disease

## 2013-02-19 ENCOUNTER — Encounter: Payer: Self-pay | Admitting: Internal Medicine

## 2013-03-15 HISTORY — PX: COLONOSCOPY: SHX174

## 2013-04-03 ENCOUNTER — Encounter (INDEPENDENT_AMBULATORY_CARE_PROVIDER_SITE_OTHER): Payer: Self-pay

## 2013-04-03 ENCOUNTER — Encounter: Payer: Self-pay | Admitting: Internal Medicine

## 2013-04-03 ENCOUNTER — Ambulatory Visit (INDEPENDENT_AMBULATORY_CARE_PROVIDER_SITE_OTHER): Payer: Medicare Other | Admitting: Internal Medicine

## 2013-04-03 VITALS — BP 128/86 | HR 62 | Temp 98.2°F | Wt 219.0 lb

## 2013-04-03 DIAGNOSIS — K219 Gastro-esophageal reflux disease without esophagitis: Secondary | ICD-10-CM

## 2013-04-03 DIAGNOSIS — Z720 Tobacco use: Secondary | ICD-10-CM

## 2013-04-03 DIAGNOSIS — E785 Hyperlipidemia, unspecified: Secondary | ICD-10-CM

## 2013-04-03 DIAGNOSIS — E669 Obesity, unspecified: Secondary | ICD-10-CM

## 2013-04-03 DIAGNOSIS — F17200 Nicotine dependence, unspecified, uncomplicated: Secondary | ICD-10-CM

## 2013-04-03 DIAGNOSIS — I10 Essential (primary) hypertension: Secondary | ICD-10-CM

## 2013-04-03 LAB — COMPREHENSIVE METABOLIC PANEL
ALK PHOS: 75 U/L (ref 39–117)
ALT: 30 U/L (ref 0–35)
AST: 20 U/L (ref 0–37)
Albumin: 4.3 g/dL (ref 3.5–5.2)
BILIRUBIN TOTAL: 0.6 mg/dL (ref 0.3–1.2)
BUN: 12 mg/dL (ref 6–23)
CO2: 28 mEq/L (ref 19–32)
CREATININE: 0.9 mg/dL (ref 0.4–1.2)
Calcium: 9.2 mg/dL (ref 8.4–10.5)
Chloride: 103 mEq/L (ref 96–112)
GFR: 70.05 mL/min (ref >60.00)
Glucose, Bld: 91 mg/dL (ref 70–99)
Potassium: 4.2 mEq/L (ref 3.5–5.1)
Sodium: 140 mEq/L (ref 135–145)
Total Protein: 7.1 g/dL (ref 6.0–8.3)

## 2013-04-03 LAB — LIPID PANEL
CHOLESTEROL: 226 mg/dL — AB (ref 0–200)
HDL: 41.7 mg/dL (ref >39.00)
Total CHOL/HDL Ratio: 5
Triglycerides: 352 mg/dL — ABNORMAL HIGH (ref 0.0–149.0)
VLDL: 70.4 mg/dL — ABNORMAL HIGH (ref 0.0–40.0)

## 2013-04-03 LAB — HEMOGLOBIN A1C: Hgb A1c MFr Bld: 6 % (ref 4.6–6.5)

## 2013-04-03 LAB — LDL CHOLESTEROL, DIRECT: Direct LDL: 135.8 mg/dL

## 2013-04-03 MED ORDER — METOPROLOL SUCCINATE ER 50 MG PO TB24
50.0000 mg | ORAL_TABLET | Freq: Every day | ORAL | Status: DC
Start: 2013-04-03 — End: 2014-01-21

## 2013-04-03 MED ORDER — ESOMEPRAZOLE MAGNESIUM 40 MG PO CPDR: 40.0000 mg | DELAYED_RELEASE_CAPSULE | Freq: Two times a day (BID) | ORAL | Status: DC

## 2013-04-03 NOTE — Progress Notes (Signed)
Pre-visit discussion using our clinic review tool. No additional management support is needed unless otherwise documented below in the visit note.  

## 2013-04-03 NOTE — Assessment & Plan Note (Signed)
BP Readings from Last 3 Encounters:  04/03/13 128/86  11/07/12 120/84  10/30/12 132/90   BP well controlled on current medication. Will continue. Will check renal function with labs today.

## 2013-04-04 ENCOUNTER — Telehealth: Payer: Self-pay | Admitting: *Deleted

## 2013-04-04 NOTE — Telephone Encounter (Signed)
Called [phone] for prior authorization on the Nexium 40 mg, form is being faxed over

## 2013-04-04 NOTE — Assessment & Plan Note (Signed)
Symptoms well controlled with Nexium. Will continue. 

## 2013-04-04 NOTE — Assessment & Plan Note (Signed)
Will check lipids and LFTs with labs. 

## 2013-04-04 NOTE — Assessment & Plan Note (Signed)
Wt Readings from Last 3 Encounters:  04/03/13 219 lb (99.338 kg)  11/07/12 227 lb 12 oz (103.307 kg)  10/30/12 230 lb (104.327 kg)   Congratulated pt on weight loss. Encouraged healthy diet and regular exercise.

## 2013-04-04 NOTE — Assessment & Plan Note (Signed)
Encouraged smoking cessation 

## 2013-04-04 NOTE — Progress Notes (Signed)
Subjective:    Patient ID: Angel Moss, female    DOB: [birth year], 53 y.o.   MRN: [id number]  HPI 53 year old female with history of hypertension, hyperlipidemia, obesity, tobacco abuse presents for followup. She reports that she is generally feeling well. She is trying to lose weight. She is following a healthier diet. She is trying to be more physically active. She is compliant with medications. She recently underwent cholecystectomy and denies any recurrent symptoms of abdominal pain, nausea.  Outpatient Encounter Prescriptions as of 04/03/2013  Medication Sig  . aspirin 81 MG chewable tablet Chew 81 mg by mouth daily.  . Biotin 5000 MCG TABS Take 3 tablets by mouth daily.    . clonazePAM (KLONOPIN) 1 MG tablet Take 1 mg by mouth 2 (two) times daily as needed.    Marland Kitchen esomeprazole (NEXIUM) 40 MG capsule Take 1 capsule (40 mg total) by mouth 2 (two) times daily.  . fluticasone (FLOVENT HFA) 110 MCG/ACT inhaler Inhale 2 puffs into the lungs 2 (two) times daily.    . metoprolol succinate (TOPROL-XL) 50 MG 24 hr tablet Take 1 tablet (50 mg total) by mouth daily. Take with or immediately following a meal.  . nystatin (MYCOSTATIN/NYSTOP) 100000 UNIT/GM POWD Apply 1 g topically 2 (two) times daily.  . sertraline (ZOLOFT) 100 MG tablet Take 100 mg by mouth 2 (two) times daily.   . simvastatin (ZOCOR) 40 MG tablet Take 1 tablet (40 mg total) by mouth at bedtime.  . VENTOLIN HFA 108 (90 BASE) MCG/ACT inhaler Inhale 2 puffs into the lungs as needed.    BP 128/86  Pulse 62  Temp(Src) 98.2 F (36.8 C) (Oral)  Wt 219 lb (99.338 kg)  SpO2 97%  Review of Systems  Constitutional: Negative for fever, chills, appetite change, fatigue and unexpected weight change.  HENT: Negative for congestion, ear pain, sinus pressure, sore throat, trouble swallowing and voice change.   Eyes: Negative for visual disturbance.  Respiratory: Negative for cough, shortness of breath, wheezing and stridor.     Cardiovascular: Negative for chest pain, palpitations and leg swelling.  Gastrointestinal: Negative for nausea, vomiting, abdominal pain, diarrhea, constipation, blood in stool, abdominal distention and anal bleeding.  Genitourinary: Negative for dysuria and flank pain.  Musculoskeletal: Negative for arthralgias, gait problem, myalgias and neck pain.  Skin: Negative for color change and rash.  Neurological: Negative for dizziness and headaches.  Hematological: Negative for adenopathy. Does not bruise/bleed easily.  Psychiatric/Behavioral: Negative for suicidal ideas, sleep disturbance and dysphoric mood. The patient is not nervous/anxious.        Objective:   Physical Exam  Constitutional: She is oriented to person, place, and time. She appears well-developed and well-nourished. No distress.  HENT:  Head: Normocephalic and atraumatic.  Right Ear: External ear normal.  Left Ear: External ear normal.  Nose: Nose normal.  Mouth/Throat: Oropharynx is clear and moist. No oropharyngeal exudate.  Eyes: Conjunctivae are normal. Pupils are equal, round, and reactive to light. Right eye exhibits no discharge. Left eye exhibits no discharge. No scleral icterus.  Neck: Normal range of motion. Neck supple. No tracheal deviation present. No thyromegaly present.  Cardiovascular: Normal rate, regular rhythm, normal heart sounds and intact distal pulses.  Exam reveals no gallop and no friction rub.   No murmur heard. Pulmonary/Chest: Effort normal and breath sounds normal. No accessory muscle usage. Not tachypneic. No respiratory distress. She has no decreased breath sounds. She has no wheezes. She has no rhonchi. She has no rales. She  exhibits no tenderness.  Musculoskeletal: Normal range of motion. She exhibits no edema and no tenderness.  Lymphadenopathy:    She has no cervical adenopathy.  Neurological: She is alert and oriented to person, place, and time. No cranial nerve deficit. She exhibits  normal muscle tone. Coordination normal.  Skin: Skin is warm and dry. No rash noted. She is not diaphoretic. No erythema. No pallor.  Psychiatric: She has a normal mood and affect. Her behavior is normal. Judgment and thought content normal.          Assessment & Plan:

## 2013-04-05 ENCOUNTER — Telehealth: Payer: Self-pay | Admitting: Internal Medicine

## 2013-04-05 NOTE — Telephone Encounter (Signed)
States she taking statin.  Says Zocor caused aches.  States her insurance will not cover Crestor.  States her mother takes atorvastatin and she would like to try that if possible.    Also would like to know about authorization on Nexium.

## 2013-04-05 NOTE — Telephone Encounter (Signed)
Received PA request form placed in Dr.Tullos box

## 2013-04-05 NOTE — Telephone Encounter (Signed)
Correction:  Dr.walkers box

## 2013-04-09 NOTE — Telephone Encounter (Signed)
The patient has been with her medication all weekend and she is wanting a rush put on her prior authorization.

## 2013-04-10 MED ORDER — ATORVASTATIN CALCIUM 20 MG PO TABS: 20.0000 mg | ORAL_TABLET | Freq: Every day | ORAL | Status: DC

## 2013-04-10 NOTE — Telephone Encounter (Signed)
Prescription sent to pharmacy on file per patient request.

## 2013-04-10 NOTE — Telephone Encounter (Signed)
Pt called to ensure generic lipitor would be prescribed for her.  Advised medication has not yet been called in but atorvastatin is the generic and that Dr. Gilford Rile has approved.

## 2013-04-10 NOTE — Telephone Encounter (Signed)
OK. Fine to call in Atorvastatin 20mg  daily #30 with 3 refill.

## 2013-04-10 NOTE — Telephone Encounter (Signed)
Patient called again today to check on the status of the Nexium. While on the phone she mentioned the note listed below about her taking Atorvastatin like her mother takes since she can not Lipitor or Zocor due to the cramps and Crestor is not covered by her insurance. Informed patient the medication she is requesting is generic Lipitor and the instructions given per her lab results. However patient stated she would give Lipitor a try again and call this into [facility] on [address]. Also left patient some samples of Nexium upfront until PA is completed.

## 2013-04-12 LAB — HM COLONOSCOPY

## 2013-04-17 ENCOUNTER — Telehealth: Payer: Self-pay | Admitting: Internal Medicine

## 2013-04-17 NOTE — Telephone Encounter (Signed)
Relevant patient education assigned to patient using Emmi. ° °

## 2013-04-18 ENCOUNTER — Telehealth: Payer: Self-pay | Admitting: Internal Medicine

## 2013-04-18 NOTE — Telephone Encounter (Signed)
Relevant patient education mailed to patient.  

## 2013-04-23 ENCOUNTER — Telehealth: Payer: Self-pay | Admitting: *Deleted

## 2013-04-23 NOTE — Telephone Encounter (Signed)
Left message for patient to return call to office in reference to prior autho for Nexium. Dr. Gilford Rile would like to know if she has tried anything other than Nexium.

## 2013-04-25 NOTE — Telephone Encounter (Signed)
OK. Please ask Hoyle Sauer to start PA for Crestor. She may have to get Dr. Tiffany Kocher to fill out PA for Nexium.

## 2013-04-25 NOTE — Telephone Encounter (Signed)
Patient returned call and left a voicemail stating she spoke with someone by the name of Larkin Ina at her insurance company. He told her it should not be a problem for her to get the Nexium due to the notes she has from Dr. Vira Agar stating she has mild to chronic gastritis. Also she is taking Atorvastatin, it is doing the same thing as it did before. She is having the muscle aches and hurting all over. Would like for a PA to be completed for the Crestor as well.

## 2013-05-03 NOTE — Telephone Encounter (Signed)
Called [phone] and as of 02.09.2015 the quantity limit has been lifted, pt needs to go to the pharmacy, now no PA is needed

## 2013-05-03 NOTE — Telephone Encounter (Signed)
Have you already began the PA for the Crestor?

## 2013-05-03 NOTE — Telephone Encounter (Signed)
Left detailed information on patients voice mail

## 2013-05-03 NOTE — Telephone Encounter (Signed)
Form was placed in Dr.walkers box on 01.21.2015, i will call again and request another form

## 2013-05-04 ENCOUNTER — Telehealth: Payer: Self-pay | Admitting: Internal Medicine

## 2013-05-04 MED ORDER — ROSUVASTATIN CALCIUM 10 MG PO TABS: 10.0000 mg | ORAL_TABLET | Freq: Every day | ORAL | Status: DC

## 2013-05-04 NOTE — Telephone Encounter (Signed)
Informed patient I sent in her prescription for the Crestor. If any problems then let us know

## 2013-05-04 NOTE — Telephone Encounter (Signed)
Pt states she is returning a call about a prescription for Crestor.  States she called [facility], [place] about the prescription and they had no idea what the pt was talking about.  Please call pt at [phone]

## 2013-06-20 ENCOUNTER — Encounter: Payer: Self-pay | Admitting: *Deleted

## 2013-06-28 ENCOUNTER — Encounter: Payer: Medicare Other | Admitting: Internal Medicine

## 2013-07-25 ENCOUNTER — Encounter: Payer: Medicare Other | Admitting: Internal Medicine

## 2013-08-02 ENCOUNTER — Encounter: Payer: Self-pay | Admitting: Adult Health

## 2013-08-02 ENCOUNTER — Encounter (INDEPENDENT_AMBULATORY_CARE_PROVIDER_SITE_OTHER): Payer: Self-pay

## 2013-08-02 ENCOUNTER — Ambulatory Visit (INDEPENDENT_AMBULATORY_CARE_PROVIDER_SITE_OTHER): Payer: Medicare Other | Admitting: Adult Health

## 2013-08-02 VITALS — BP 124/82 | HR 67 | Temp 98.5°F | Resp 14 | Wt 214.0 lb

## 2013-08-02 DIAGNOSIS — W57XXXA Bitten or stung by nonvenomous insect and other nonvenomous arthropods, initial encounter: Secondary | ICD-10-CM

## 2013-08-02 DIAGNOSIS — R109 Unspecified abdominal pain: Secondary | ICD-10-CM

## 2013-08-02 DIAGNOSIS — T148 Other injury of unspecified body region: Secondary | ICD-10-CM

## 2013-08-02 NOTE — Patient Instructions (Signed)
  Use cortisone 10 for your neck and legs.  Take a antihistamine such as allegra, claritin or zyrtec.  Apply ice to your legs for 15 min. Do not apply directly on your skin.  Benadryl lotion may also help with itching.  I am sending you to ultrasound.  Stay away from the fire ants.  Matching wallet for your [facility] bag!!

## 2013-08-02 NOTE — Progress Notes (Signed)
Patient ID: Angel Moss, female   DOB: 11-21-1960, 53 y.o.   MRN: [id number]   Subjective:    Patient ID: Angel Moss, female    DOB: Apr 10, 1960, 53 y.o.   MRN: [id number]  HPI Pt is a pleasant 53 y/o female who presents to clinic with several concerns:  1) multiple insect bites - fire ants on bilateral lower extremities. Extremely itchy. She has scratched herself to the point that she has irritated the areas. She has a bite on her neck. Does not know what bit her there. She was mowing the lawn and felt something crawling on her neck. She also removed a tick from her left posterior shoulder area. She reports the tick was embedded but not even 24 hours.  2) she reports lower abdominal pain following intercourse. She denies dyspareunia. She reports the pain occurs afterward. Cannot really blame intercourse for the pain but has just noticed it afterward. The pain is in the right lower quadrants. She is status post total hysterectomy. Still has both ovaries. Reports having ultrasound ~ 2 years ago and everything was fine. She is concerned because she has a history of cervical cancer and wants to make sure that she does not have any problem with her ovaries.   3) She has also been experiencing upper abdominal pain. The pain occurs randomly. She is s/p cholecystectomy. Hx of GERD. Cannot tell me what makes her symptoms better or worse. She denies any n/v/d.   Past Medical History  Diagnosis Date  . Obesity   . Anxiety   . Mitral valve prolapse   . Sleep apnea   . History of kidney stones   . Hyperlipidemia   . Cervical cancer     Current Outpatient Prescriptions on File Prior to Visit  Medication Sig Dispense Refill  . clonazePAM (KLONOPIN) 1 MG tablet Take 1 mg by mouth 2 (two) times daily as needed.        Marland Kitchen esomeprazole (NEXIUM) 40 MG capsule Take 1 capsule (40 mg total) by mouth 2 (two) times daily.  180 capsule  3  . fluticasone (FLOVENT HFA) 110 MCG/ACT inhaler Inhale 2 puffs into the  lungs 2 (two) times daily.        . metoprolol succinate (TOPROL-XL) 50 MG 24 hr tablet Take 1 tablet (50 mg total) by mouth daily. Take with or immediately following a meal.  90 tablet  3  . rosuvastatin (CRESTOR) 10 MG tablet Take 1 tablet (10 mg total) by mouth daily.  30 tablet  3  . sertraline (ZOLOFT) 100 MG tablet Take 100 mg by mouth 2 (two) times daily.       . VENTOLIN HFA 108 (90 BASE) MCG/ACT inhaler Inhale 2 puffs into the lungs as needed.        No current facility-administered medications on file prior to visit.     Review of Systems  Constitutional: Negative.  Negative for fever, chills and fatigue.  HENT: Negative.   Eyes: Negative.   Respiratory: Negative.   Cardiovascular: Negative.   Gastrointestinal: Positive for abdominal pain (upper and lower quadrants) and abdominal distention. Negative for nausea, vomiting, diarrhea, constipation and blood in stool.  Endocrine: Negative.   Genitourinary: Negative.  Negative for difficulty urinating and dyspareunia.  Musculoskeletal: Negative.   Skin: Positive for rash (multiple insect bites).  Allergic/Immunologic: Negative.   Neurological: Negative.   Hematological: Negative.   Psychiatric/Behavioral: Negative.        Objective:  BP 124/82  Pulse 67  Temp(Src) 98.5 F (36.9 C) (Oral)  Resp 14  Wt 214 lb (97.07 kg)  SpO2 96%   Physical Exam  Constitutional: She is oriented to person, place, and time. No distress.  HENT:  Head: Normocephalic and atraumatic.  Eyes: Conjunctivae and EOM are normal.  Neck: Normal range of motion. Neck supple.  Cardiovascular: Normal rate and regular rhythm.  Exam reveals friction rub.   Pulmonary/Chest: Effort normal. No respiratory distress.  Abdominal: Soft. Bowel sounds are normal. She exhibits distension. There is tenderness (palpation of right lower quadrant). There is no rebound and no guarding.  Abdomen is soft. No palpable masses; however, body habitus hindering exam    Musculoskeletal: Normal range of motion.  Neurological: She is alert and oriented to person, place, and time. She has normal reflexes. Coordination normal.  Skin: Skin is warm and dry.  Psychiatric: She has a normal mood and affect. Her behavior is normal. Judgment and thought content normal.       Assessment & Plan:   1. Insect bite Bilateral lower extremities with secondary lesions d/t patient manipulation. Apply ice to the affected areas, antihistamine, topical antihistamine, cortisone 10.   2. Abdominal pain, other specified site Abdominal pain of unknown etiology. ?Adhesions, gastritis, ovarian cyst/mass. Will send patient for ultrasound of abdomen and pelvis - US Abdomen Complete; Future - US Pelvis Complete; Future - US Transvaginal Non-OB; Future

## 2013-08-02 NOTE — Progress Notes (Signed)
Pre visit review using our clinic review tool, if applicable. No additional management support is needed unless otherwise documented below in the visit note. 

## 2013-08-10 ENCOUNTER — Telehealth: Payer: Self-pay | Admitting: Internal Medicine

## 2013-08-10 NOTE — Telephone Encounter (Signed)
Pt checking status of scans ordered by R. Rey for her abdomen/pelvis.

## 2013-08-14 ENCOUNTER — Telehealth: Payer: Self-pay | Admitting: Internal Medicine

## 2013-08-14 MED ORDER — AMOXICILLIN 500 MG PO CAPS: 2000.0000 mg | ORAL_CAPSULE | Freq: Once | ORAL | Status: DC

## 2013-08-14 NOTE — Telephone Encounter (Signed)
I have called this in for her.

## 2013-08-14 NOTE — Telephone Encounter (Signed)
Pt.notified

## 2013-08-14 NOTE — Telephone Encounter (Signed)
Patient states she has a dentist appointment coming up next week and needs the 2000 mg amoxicillin called into [facility] on [address]. Please advise when this has been done.

## 2013-08-14 NOTE — Telephone Encounter (Signed)
After talking with Raquel she states patient has had pain all over the abdomen and its not just one spot. She would like to see all woman parts. Spoke with Bethena Roys at [facility] patient is scheduled for August 16, 2013 arrive at the [place] entrance of [facility] at 8:00 for a 8:30 appointment. She needs to be NPO after midnight and needs to drink 32 oz of water 1 hour prior to exam and don't void. I have left a detailed msg on home phone also requested the patient return my call to let me know she did receive this message.

## 2013-08-16 ENCOUNTER — Ambulatory Visit: Payer: Self-pay | Admitting: Adult Health

## 2013-08-20 ENCOUNTER — Telehealth: Payer: Self-pay | Admitting: Adult Health

## 2013-08-20 NOTE — Telephone Encounter (Signed)
Ultrasound of pelvis was normal

## 2013-08-20 NOTE — Telephone Encounter (Signed)
Abdominal ultrasound:  Kidney stone in each kidney. No obstruction Aorta in abdomen at risk for development of aneurysm. Need to repeat ultrasound in 5 years. Fatty liver - need to follow diet that is low is saturated fat, lean protein, fruits, vegetables. Please schedule appt with Dr. Gilford Rile in 3 months for follow up. Please schedule appt with pt on the telephone.

## 2013-08-21 NOTE — Telephone Encounter (Signed)
Patient notified of Raquel's comments. Patient verbalized understanding.

## 2013-09-06 LAB — HM MAMMOGRAPHY: HM Mammogram: NORMAL

## 2013-09-07 ENCOUNTER — Encounter: Payer: Self-pay | Admitting: Adult Health

## 2013-09-07 ENCOUNTER — Encounter: Payer: Self-pay | Admitting: *Deleted

## 2013-09-07 ENCOUNTER — Ambulatory Visit: Payer: Self-pay | Admitting: Internal Medicine

## 2013-09-07 LAB — HM MAMMOGRAPHY: HM Mammogram: NEGATIVE

## 2013-09-08 ENCOUNTER — Other Ambulatory Visit: Payer: Self-pay | Admitting: Internal Medicine

## 2013-09-10 ENCOUNTER — Encounter: Payer: Self-pay | Admitting: Internal Medicine

## 2013-09-10 ENCOUNTER — Encounter (INDEPENDENT_AMBULATORY_CARE_PROVIDER_SITE_OTHER): Payer: Self-pay

## 2013-09-10 ENCOUNTER — Ambulatory Visit (INDEPENDENT_AMBULATORY_CARE_PROVIDER_SITE_OTHER): Payer: Medicare Other | Admitting: Internal Medicine

## 2013-09-10 ENCOUNTER — Other Ambulatory Visit: Payer: Self-pay | Admitting: *Deleted

## 2013-09-10 VITALS — BP 120/78 | HR 60 | Temp 98.3°F | Ht 65.0 in | Wt 210.2 lb

## 2013-09-10 DIAGNOSIS — E669 Obesity, unspecified: Secondary | ICD-10-CM

## 2013-09-10 DIAGNOSIS — Z720 Tobacco use: Secondary | ICD-10-CM

## 2013-09-10 DIAGNOSIS — I10 Essential (primary) hypertension: Secondary | ICD-10-CM

## 2013-09-10 DIAGNOSIS — E785 Hyperlipidemia, unspecified: Secondary | ICD-10-CM

## 2013-09-10 DIAGNOSIS — F17200 Nicotine dependence, unspecified, uncomplicated: Secondary | ICD-10-CM

## 2013-09-10 DIAGNOSIS — K219 Gastro-esophageal reflux disease without esophagitis: Secondary | ICD-10-CM

## 2013-09-10 DIAGNOSIS — Z0000 Encounter for general adult medical examination without abnormal findings: Secondary | ICD-10-CM

## 2013-09-10 LAB — LIPID PANEL
CHOL/HDL RATIO: 5
Cholesterol: 234 mg/dL — ABNORMAL HIGH (ref 0–200)
HDL: 45 mg/dL (ref >39.00)
LDL Cholesterol: 135 mg/dL — ABNORMAL HIGH (ref 0–99)
NONHDL: 189
Triglycerides: 270 mg/dL — ABNORMAL HIGH (ref 0.0–149.0)
VLDL: 54 mg/dL — ABNORMAL HIGH (ref 0.0–40.0)

## 2013-09-10 LAB — COMPREHENSIVE METABOLIC PANEL
ALBUMIN: 4.5 g/dL (ref 3.5–5.2)
ALT: 26 U/L (ref 0–35)
AST: 18 U/L (ref 0–37)
Alkaline Phosphatase: 76 U/L (ref 39–117)
BUN: 23 mg/dL (ref 6–23)
CHLORIDE: 104 meq/L (ref 96–112)
CO2: 28 meq/L (ref 19–32)
CREATININE: 1 mg/dL (ref 0.4–1.2)
Calcium: 9.2 mg/dL (ref 8.4–10.5)
GFR: 64.08 mL/min (ref >60.00)
Glucose, Bld: 106 mg/dL — ABNORMAL HIGH (ref 70–99)
POTASSIUM: 4.4 meq/L (ref 3.5–5.1)
Sodium: 139 mEq/L (ref 135–145)
Total Bilirubin: 0.4 mg/dL (ref 0.2–1.2)
Total Protein: 7.7 g/dL (ref 6.0–8.3)

## 2013-09-10 MED ORDER — ROSUVASTATIN CALCIUM 10 MG PO TABS: 10.0000 mg | ORAL_TABLET | Freq: Every day | ORAL | Status: DC

## 2013-09-10 MED ORDER — ROSUVASTATIN CALCIUM 20 MG PO TABS: 20.0000 mg | ORAL_TABLET | Freq: Every day | ORAL | Status: DC

## 2013-09-10 NOTE — Progress Notes (Signed)
Pre visit review using our clinic review tool, if applicable. No additional management support is needed unless otherwise documented below in the visit note. 

## 2013-09-10 NOTE — Assessment & Plan Note (Signed)
Encouraged smoking cessation. Pt is not interested in quitting at this time.

## 2013-09-10 NOTE — Progress Notes (Signed)
The patient is here for annual Medicare Wellness Examination and management of other chronic and acute problems.   The risk factors are reflected in the history.  The roster of all physicians providing medical care to patient - is listed in the Snapshot section of the chart.  Activities of daily living:   The patient is 100% independent in all ADLs: dressing, toileting, feeding as well as independent mobility. Patient lives with mother. Has 2 Yorkies. One story home. Hardwood floors.  Home safety :  The patient has smoke detectors in the home. Planning to get CO detectors. They wear seatbelts in their car. There are no firearms at home.  There is no violence in the home. They feel safe where they live.  Infectious Risks: There is no risks for hepatitis, STDs or HIV.  There is no  history of blood transfusion.  They have no travel history to infectious disease endemic areas of the world.  Additional Health Care Providers: The patient has seen their dentist in the last six months. Dentist - Dr. Leanor Kail They have seen their eye doctor in the last year. Opthalmologist - [facility], Dr. Thomasene Ripple They deny hearing issues. They have deferred audiologic testing in the last year.   They do not  have excessive sun exposure. Discussed the need for sun protection: hats,long sleeves and use of sunscreen if there is significant sun exposure.  Dermatologist - none  Diet: the importance of a healthy diet is discussed. They do have a relatively healthy diet.  The benefits of regular aerobic exercise were discussed. Patient exercises by staying active at home, gardening.  Depression screen: there are no signs or vegative symptoms of depression- irritability, change in appetite, anhedonia, sadness/tearfullness.  Cognitive assessment: the patient manages all their financial and personal affairs and is actively engaged.   The following portions of the patient's history were reviewed and  updated as appropriate: allergies, current medications, past family history, past medical history,  past surgical history, past social history and problem list.  Visual acuity was not assessed per patient preference as they have regular follow up with their ophthalmologist. Hearing and body mass index were assessed and reviewed.   During the course of the visit the patient was educated and counseled about appropriate screening and preventive services including : fall prevention , diabetes screening, nutrition counseling, colorectal cancer screening, and recommended immunizations.    Pt is upset that we will not write for controlled substances in our office. We reviewed the letter from her insurance which stated that she was receiving multiple narcotic and other controlled substances from multiple providers. Copy of letter given to her for review.  Review of Systems  Constitutional: Negative for fever, chills, appetite change, fatigue and unexpected weight change.  Eyes: Negative for visual disturbance.  Respiratory: Negative for shortness of breath.   Cardiovascular: Negative for chest pain and leg swelling.  Gastrointestinal: Negative for vomiting, abdominal pain, diarrhea, constipation and blood in stool.  Skin: Negative for color change and rash.  Hematological: Negative for adenopathy. Does not bruise/bleed easily.  Psychiatric/Behavioral: Negative for suicidal ideas, sleep disturbance and dysphoric mood. The patient is nervous/anxious.        Objective:    BP 120/78  Pulse 60  Temp(Src) 98.3 F (36.8 C) (Oral)  Ht 5\' 5"  (1.651 m)  Wt 210 lb 4 oz (95.369 kg)  BMI 34.99 kg/m2  SpO2 97% Physical Exam  Constitutional: She is oriented to person, place, and time. She appears  well-developed and well-nourished. No distress.  HENT:  Head: Normocephalic and atraumatic.  Right Ear: External ear normal.  Left Ear: External ear normal.  Nose: Nose normal.  Mouth/Throat: Oropharynx is  clear and moist. No oropharyngeal exudate.  Eyes: Conjunctivae are normal. Pupils are equal, round, and reactive to light. Right eye exhibits no discharge. Left eye exhibits no discharge. No scleral icterus.  Neck: Normal range of motion. Neck supple. No tracheal deviation present. No thyromegaly present.  Cardiovascular: Normal rate, regular rhythm, normal heart sounds and intact distal pulses.  Exam reveals no gallop and no friction rub.   No murmur heard. Pulmonary/Chest: Effort normal and breath sounds normal. No accessory muscle usage. Not tachypneic. No respiratory distress. She has no decreased breath sounds. She has no wheezes. She has no rhonchi. She has no rales. She exhibits no tenderness. Right breast exhibits no inverted nipple, no mass, no nipple discharge, no skin change and no tenderness. Left breast exhibits no inverted nipple, no mass, no nipple discharge, no skin change and no tenderness. Breasts are symmetrical.  Abdominal: Soft. Bowel sounds are normal. She exhibits no distension and no mass. There is no tenderness. There is no rebound and no guarding.  Musculoskeletal: Normal range of motion. She exhibits no edema and no tenderness.  Lymphadenopathy:    She has no cervical adenopathy.  Neurological: She is alert and oriented to person, place, and time. No cranial nerve deficit. She exhibits normal muscle tone. Coordination normal.  Skin: Skin is warm and dry. No rash noted. She is not diaphoretic. No erythema. No pallor.  Psychiatric: Her behavior is normal. Judgment and thought content normal. Her mood appears anxious.          Assessment & Plan:   Problem List Items Addressed This Visit     Unprioritized   GERD (gastroesophageal reflux disease)   Hyperlipidemia   Relevant Medications      rosuvastatin (CRESTOR) tablet   Other Relevant Orders      Comprehensive metabolic panel (Completed)      Lipid panel (Completed)   Hypertension      BP Readings from Last 3  Encounters:  09/10/13 120/78  08/02/13 124/82  04/03/13 128/86   BP well controlled on current medication. Will check renal function with labs.    Relevant Medications      rosuvastatin (CRESTOR) tablet   Medicare annual wellness visit, subsequent - Primary     General medical exam normal today including breast exam. PAP and pelvic deferred as pt s/p hysterectomy. Encouraged healthy diet and regular physical activity. Labs today including CBC, CMP, lipids. Immunizations UTD. Mammogram UTD. Colonoscopy UTD.    Obesity      Wt Readings from Last 3 Encounters:  09/10/13 210 lb 4 oz (95.369 kg)  08/02/13 214 lb (97.07 kg)  04/03/13 219 lb (99.338 kg)   Body mass index is 34.99 kg/(m^2).  Encouraged continue effort at healthy diet and regular exercise with goal of weight loss.    Tobacco abuse     Encouraged smoking cessation. Pt is not interested in quitting at this time.        Return in about 6 months (around 03/12/2014) for Recheck.

## 2013-09-10 NOTE — Patient Instructions (Signed)
Congratulations on your weight loss! Keep up your efforts at healthy diet and staying active.  We will check labs today. Follow up in 6 months or sooner as needed.

## 2013-09-10 NOTE — Assessment & Plan Note (Signed)
General medical exam normal today including breast exam. PAP and pelvic deferred as pt s/p hysterectomy. Encouraged healthy diet and regular physical activity. Labs today including CBC, CMP, lipids. Immunizations UTD. Mammogram UTD. Colonoscopy UTD.

## 2013-09-10 NOTE — Assessment & Plan Note (Signed)
Wt Readings from Last 3 Encounters:  09/10/13 210 lb 4 oz (95.369 kg)  08/02/13 214 lb (97.07 kg)  04/03/13 219 lb (99.338 kg)   Body mass index is 34.99 kg/(m^2).  Encouraged continue effort at healthy diet and regular exercise with goal of weight loss.

## 2013-09-10 NOTE — Assessment & Plan Note (Signed)
BP Readings from Last 3 Encounters:  09/10/13 120/78  08/02/13 124/82  04/03/13 128/86   BP well controlled on current medication. Will check renal function with labs.

## 2013-09-11 ENCOUNTER — Telehealth: Payer: Self-pay | Admitting: Internal Medicine

## 2013-09-11 NOTE — Telephone Encounter (Signed)
Relevant patient education assigned to patient using Emmi. ° °

## 2013-10-01 ENCOUNTER — Encounter: Payer: Self-pay | Admitting: Internal Medicine

## 2013-11-22 ENCOUNTER — Telehealth: Payer: Self-pay | Admitting: *Deleted

## 2013-11-22 NOTE — Telephone Encounter (Signed)
Pt called states she discontinued her Clonazepam and her blood pressure has increased since stopping medication, 191/110.  States she is having lightheadedness also.  please advise

## 2013-11-22 NOTE — Telephone Encounter (Signed)
Needs to be seen in a visit. 

## 2013-11-26 NOTE — Telephone Encounter (Signed)
Spoke with patient she will call back in reference to an appointment.

## 2013-11-26 NOTE — Telephone Encounter (Signed)
Please call and schedule appt, thank you

## 2013-12-18 ENCOUNTER — Other Ambulatory Visit: Payer: Self-pay | Admitting: Internal Medicine

## 2014-01-21 ENCOUNTER — Other Ambulatory Visit: Payer: Self-pay | Admitting: Internal Medicine

## 2014-02-13 ENCOUNTER — Other Ambulatory Visit: Payer: Self-pay | Admitting: Internal Medicine

## 2014-03-12 ENCOUNTER — Ambulatory Visit: Payer: Medicare Other | Admitting: Internal Medicine

## 2014-03-15 IMAGING — XA IR NG/OG TUBE BY MD
6 series · 6 of 6 positions shown · non-contrast
Comparison: none

REASON FOR EXAM: hydronephrosis
COMMENTS:

[Series 1: single · 1 of 1 slices shown (1 of 6)]
[im 1/1]
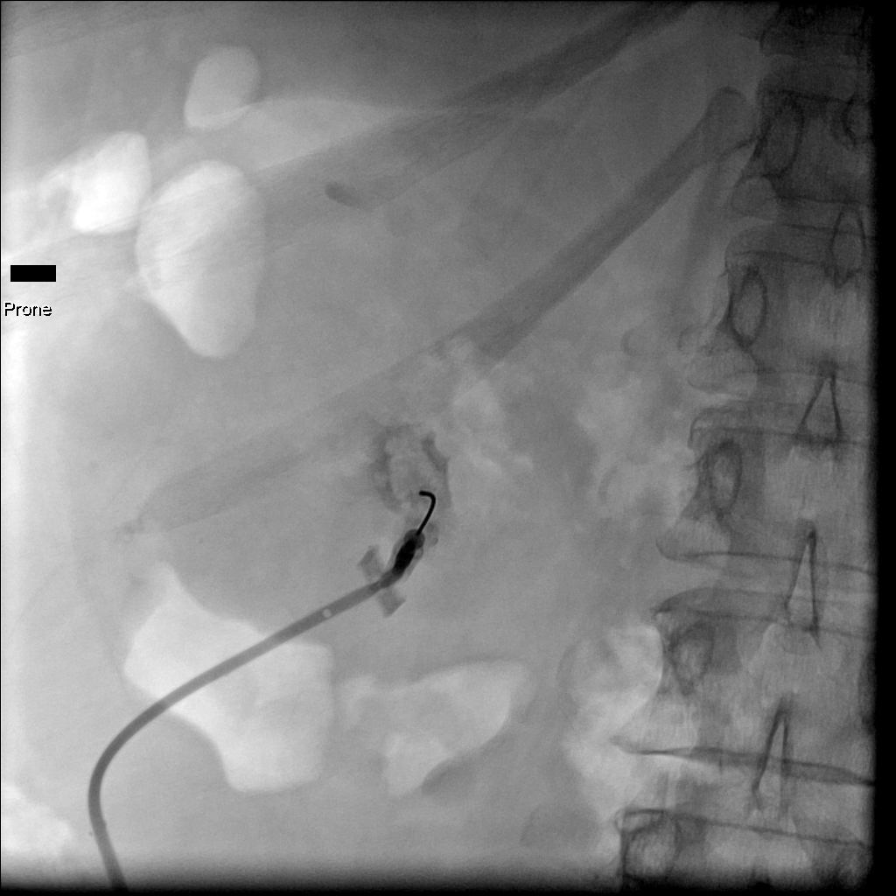

[Series 4: single · 1 of 1 slices shown (2 of 6)]
[im 1/1]
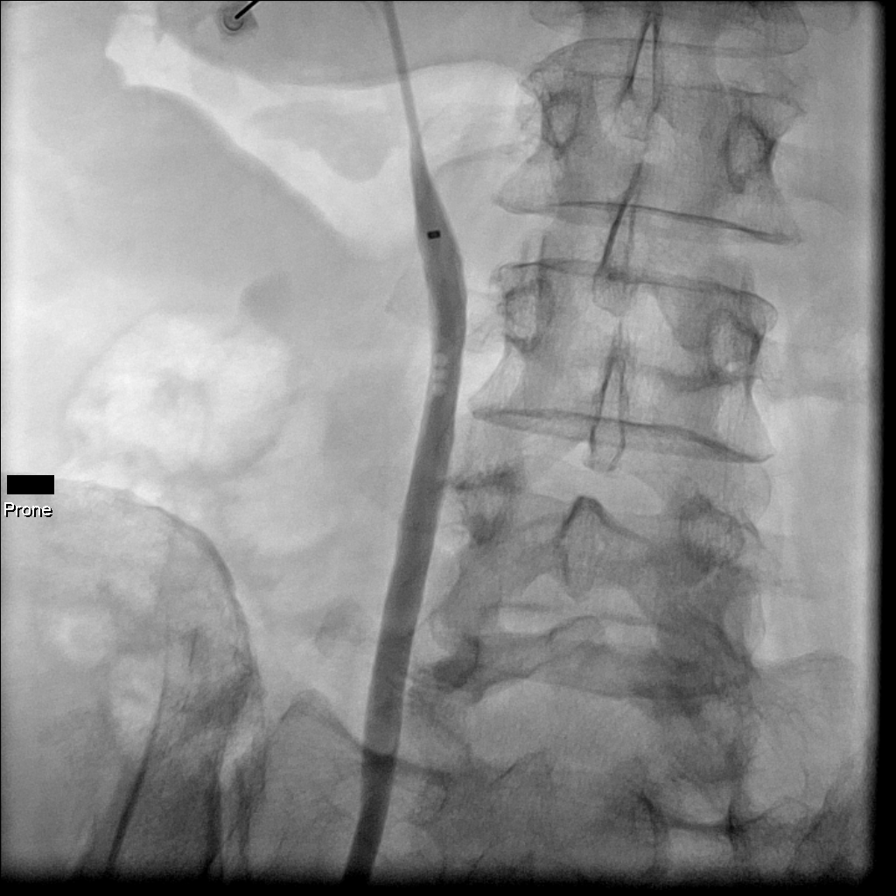

[Series 5: single · 1 of 1 slices shown (3 of 6)]
[im 1/1]
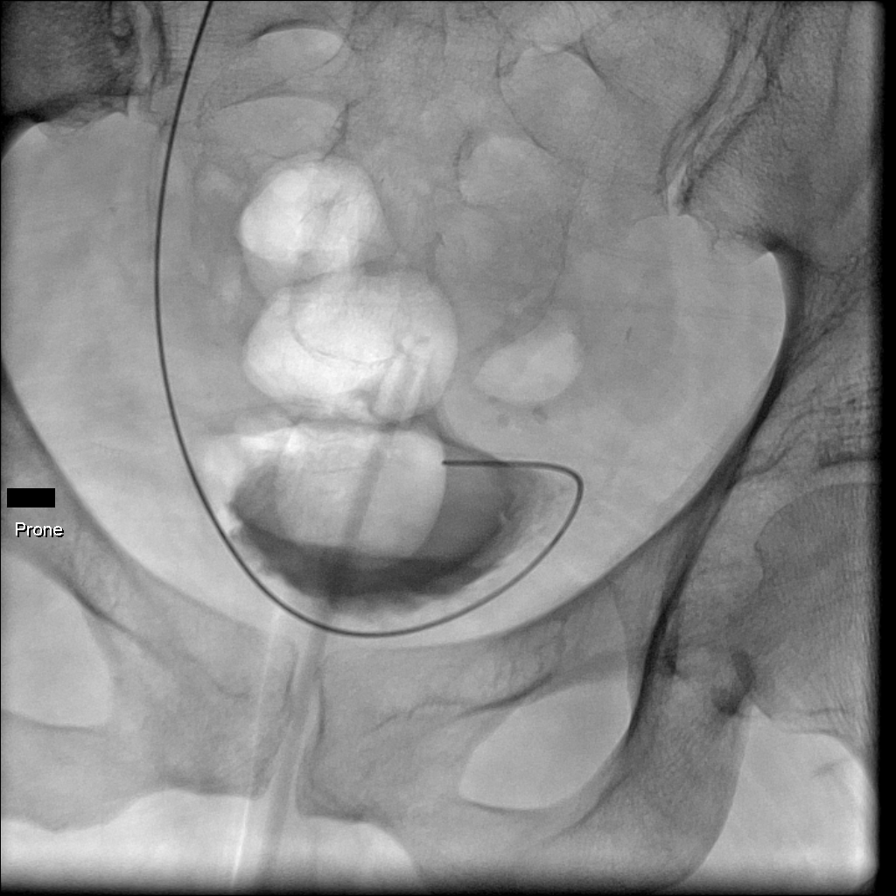

[Series 7: single · 1 of 1 slices shown (4 of 6)]
[im 1/1]
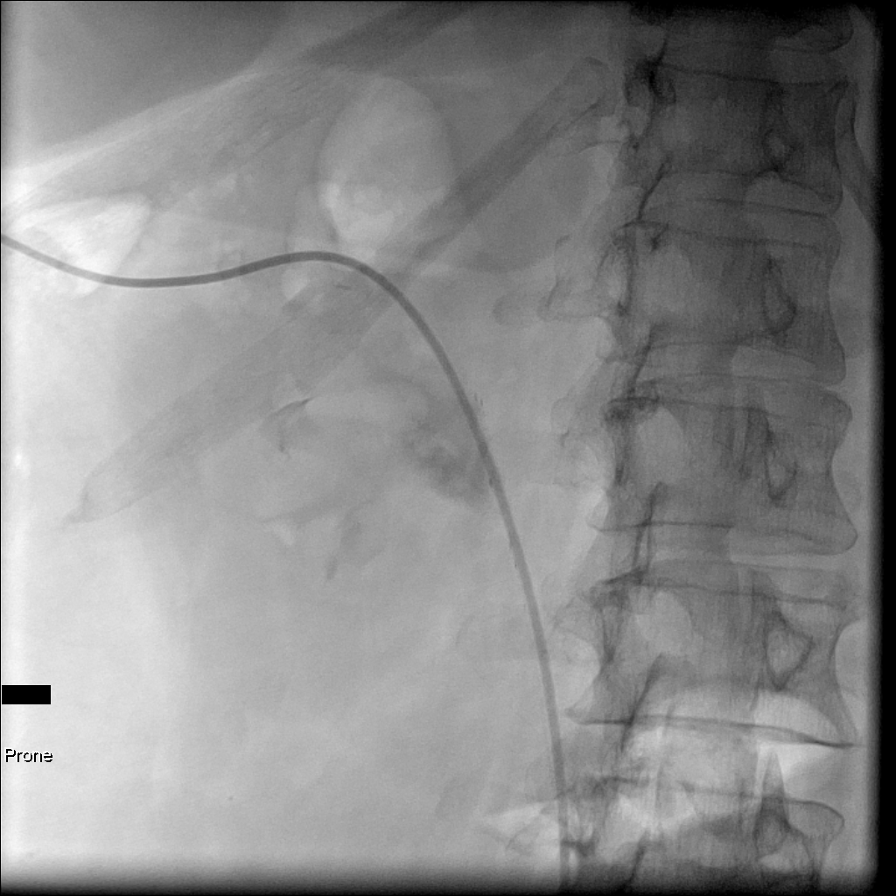

[Series 8: single · 1 of 1 slices shown (5 of 6)]
[im 1/1]
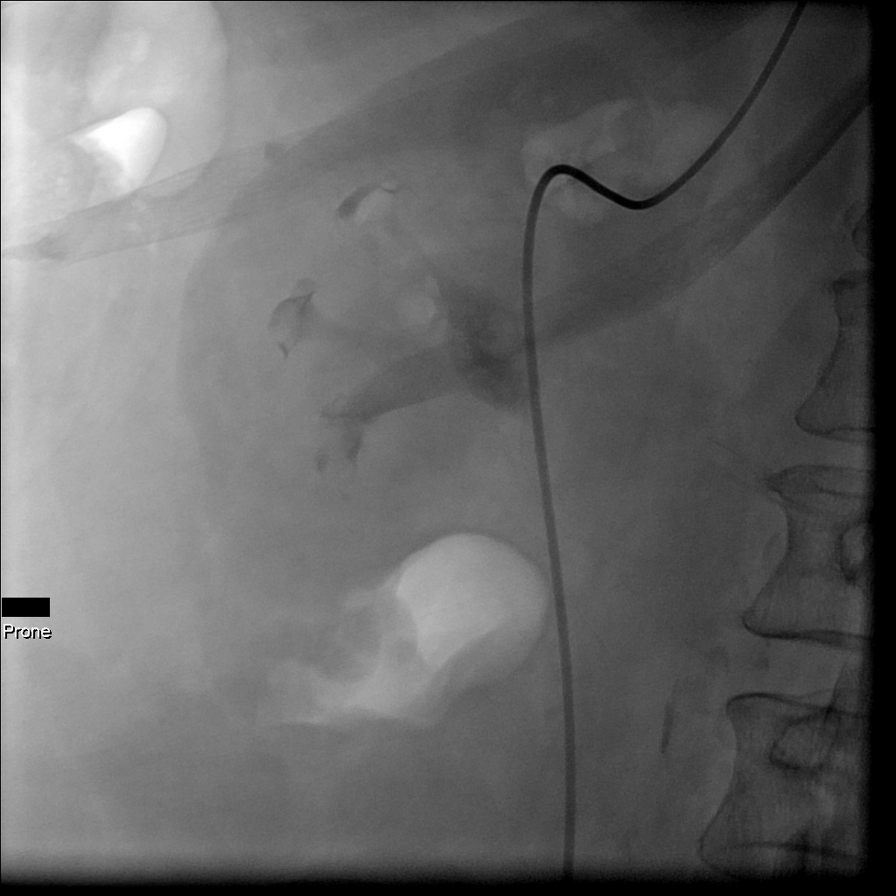

[Series 11: single · 1 of 1 slices shown (6 of 6)]
[im 1/1]
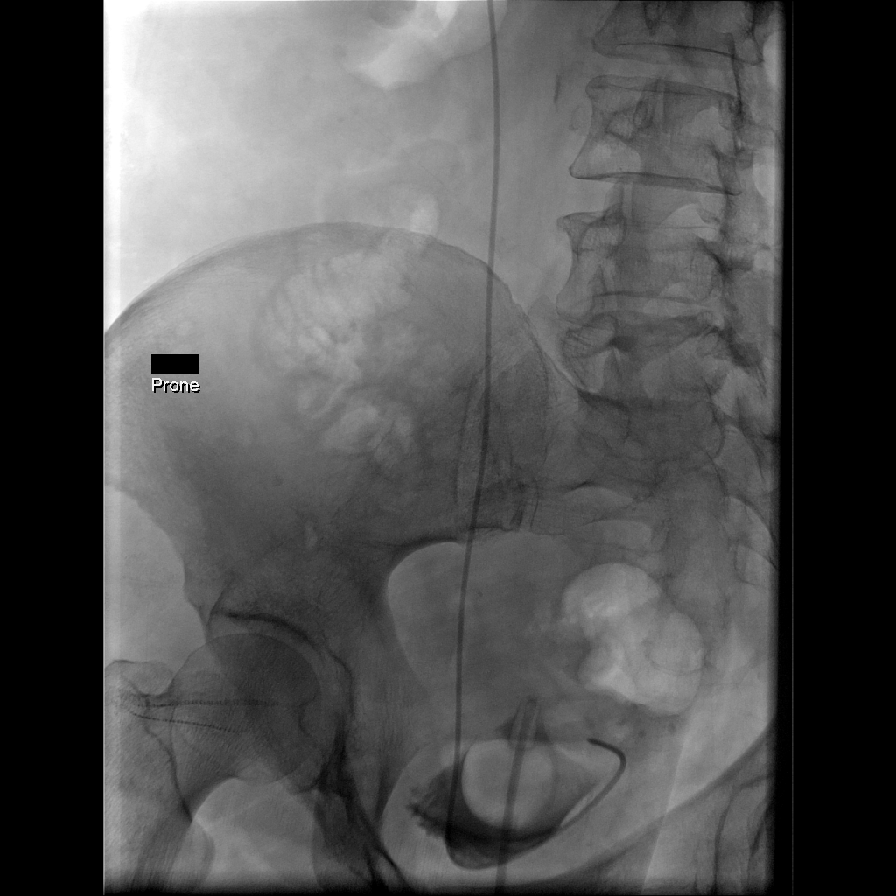

[6 of 6 positions shown; findings below may reference images not displayed]

PROCEDURE:     VAS - NEPHROSTOMY TUBE PLACEMENT  - November 23, 2011  [DATE]

RESULT:     After discussing the risk and benefits of this procedure with
the patient informed consent was obtained. The left back was sterilely
prepped and draped. 22-gauge Chiba needle was advanced into the left renal
pelvis and antegrade pyelogram was performed. This was followed by placement
of a 22-gauge Chiba needle in an upper pole posterior calyx and an 018
guidewire down the left ureter. This was followed by placement of a
French guidewire exchange system and placement of a 035 Amplatz extra-stiff
wire. This was followed by placement of a 5 French catheter down the ureter
into the bladder.
IMPRESSION: Successful left percutaneous nephrostomy and ureteral
catheter.

## 2014-03-18 ENCOUNTER — Other Ambulatory Visit: Payer: Self-pay

## 2014-03-18 MED ORDER — METOPROLOL SUCCINATE ER 50 MG PO TB24: ORAL_TABLET | ORAL | Status: AC

## 2014-03-25 ENCOUNTER — Other Ambulatory Visit: Payer: Self-pay | Admitting: Internal Medicine

## 2014-03-25 DIAGNOSIS — E782 Mixed hyperlipidemia: Secondary | ICD-10-CM | POA: Insufficient documentation

## 2014-03-25 DIAGNOSIS — Z87442 Personal history of urinary calculi: Secondary | ICD-10-CM | POA: Insufficient documentation

## 2014-04-23 ENCOUNTER — Other Ambulatory Visit: Payer: Self-pay | Admitting: Internal Medicine

## 2014-05-24 ENCOUNTER — Telehealth: Payer: Self-pay

## 2014-05-24 NOTE — Telephone Encounter (Signed)
Received a denial letter from [employer] for the esomeprazole but the letter had a different last name than what on we have on file. Tried contacting patient to confirm name change but no answer . Left voicemail requesting a call back.

## 2014-05-24 NOTE — Telephone Encounter (Signed)
Received PA request from [employer]. PA completed on cover my meds. Awaiting a response at this time.

## 2014-06-11 ENCOUNTER — Ambulatory Visit: Payer: Self-pay

## 2014-07-02 NOTE — Discharge Summary (Signed)
PATIENT NAME:  Angel Moss, Angel Moss MR#:  [id number] DATE OF BIRTH:  1961-01-20  DATE OF ADMISSION:  11/23/2011 DATE OF DISCHARGE:  11/24/2011  PREOPERATIVE DIAGNOSIS: Left nephrolithiasis.   POSTOPERATIVE DIAGNOSIS: Left nephrolithiasis.   PROCEDURE: Left percutaneous nephrolithotomy.   INDICATIONS: The patient is a 54 year old white female with a 1.3 cm left renal pelvic stone. She presents for left percutaneous nephrolithotomy.   HOSPITAL COURSE: Angel Moss was taken to the Operating Room on 11/23/2011 at which time underwent a left percutaneous nephrolithotomy. There were no significant intraoperative problems or complications. No significant bleeding was encountered. A stent was placed at the time of the procedure. Due to the relatively straightforward nature of the case the decision was made not to place a post procedure nephrostomy tube. He patient's postoperative course was without significant problems or complications. She remained afebrile, vital signs stable throughout her hospitalization. She was tolerating a general diet without difficulty. She was also ambulating without difficulty. She maintained good urine output. Her lab functions were well within normal limits. She continued to improve. The wound site remained clean, dry, and intact. She was subsequently discharged home on the morning of postoperative day one. The Foley catheter was removed prior to discharge. She was discharged on Vicodin 1 to 2 every 4 to 6 hours as needed for pain and Cipro 500 mg twice daily for five days. She is to return in approximately 7 to 10 days for stitch removal. Stent removal will also be scheduled in close proximity. She is to notify us if there are any further problems or questions in the interim.   ____________________________ Denice Bors Jacqlyn Larsen, MD bsc:cms D: 11/25/2011 08:03:43 ET T: 11/25/2011 14:56:12 ET JOB#: [id number]  cc: Denice Bors. Jacqlyn Larsen, MD, <Dictator> Denice Bors Kienna Moncada MD ELECTRONICALLY SIGNED 11/29/2011  20:25

## 2014-07-02 NOTE — Op Note (Signed)
PATIENT NAME:  Angel Moss, Angel Moss MR#:  [id number] DATE OF BIRTH:  1960/11/10  DATE OF PROCEDURE:  11/23/2011  PREOPERATIVE DIAGNOSIS: Left nephrolithiasis.   POSTOPERATIVE DIAGNOSIS: Left nephrolithiasis.  PROCEDURE: Left percutaneous nephrolithotomy.   SURGEON: [name], M.D.   ANESTHESIA: General endotracheal anesthesia.   INDICATIONS: The patient is a 54 year old white female who was referred for evaluation of a percutaneous nephrolithotomy. She was found to have an approximate 1.3 cm left renal pelvic calculus without significant obstruction. She was having intermittent pain and discomfort. Due to the stone size, the percutaneous nephrolithotomy approach was recommended for complete stone removal. She presents today for this purpose.   DESCRIPTION OF PROCEDURE: The patient was initially taken to the interventional radiology suite at which time she underwent a left percutaneous nephroureteral stent placement under radiological guidance. This will be dictated under a separate operative note by interventional radiology. She was then taken to the operating room. She was placed in the prone position on the urological table under general endotracheal anesthesia. The patient was then prepped and draped in the usual standard fashion. The nephroureteral stent was accessed. A flexible-tipped glidewire was introduced through the stent into the urinary bladder. The stent was then removed. Two sutures were cut for removal, at the level of the skin. With the guidewire in place, initial dilators were utilized from 8 to 12 [place]. The stiffener was then advanced over the guidewire into the ureter. The sheath was then advanced over the stiffener. The stiffener was then removed. A second superstiff guidewire was advanced through the sheath into the urinary bladder. All was performed under fluoroscopic guidance. The sheath was then removed. The glidewire was secured to the drapes. The stiffener was replaced over the  superstiff guidewire. Dilation was then undertaken from 65 [place] to 12 [place] with the 28 [place] dilation. The sheath was left in place. This was advanced into the accessed upper pole calyx. The dilator was then removed. Nephroscopy was then performed in the standard fashion. A small amount of clot was present in the renal pelvis. The stone was partially visible. The Cyberwand device was utilized to evacuate the blood clot making visualization of the 1.3 cm stone much more clear. The Cyberwand was then utilized to fragment the stone into multiple smaller pieces which were then aspirated through the device. Once the stone was removed, a flexible cystoscope was then utilized to navigate the lower and mid pole calyces. No additional stone fragments were identified. Several small Randall's plaques were identified over some of the calyces. The cystoscope was advanced into the proximal ureter with no significant stone fragments identified. The decision was made to proceed with stent placement. A 6 French x 24 cm double-J ureteral stent was advanced over the superstiff guidewire through the nephroscope into the renal pelvis. It was advanced to the level of the bladder under fluoroscopic and visual guidance. Upon withdrawal of the guidewire, adequate curl was noted within the urinary bladder. Adequate curl was also noted within the renal pelvis. Due to the minimal degree of manipulation required, the decision was made not to place a nephrostomy tube postprocedure. The sheath was then removed. The safety guidewire was left in place. The skin was then closed utilizing 2-0 Prolene sutures. Three interrupted sutures were utilized for good closure. The area surrounding the site was then injected with extended release pain medication. A gauze and Tegaderm dressing was then applied. The patient was returned to the supine position and awakened from general endotracheal anesthesia. She was  taken to the recovery room in stable  condition. There were no problems or complications. The patient tolerated the procedure well. Estimated blood loss was minimal.  ____________________________ Denice Bors. Jacqlyn Larsen, MD bsc:slb D: 11/23/2011 15:20:04 ET T: 11/23/2011 15:38:35 ET JOB#: [id number]  cc: Denice Bors. Jacqlyn Larsen, MD, <Dictator> Denice Bors Prestyn Stanco MD ELECTRONICALLY SIGNED 11/25/2011 8:09

## 2014-07-05 NOTE — Op Note (Signed)
PATIENT NAME:  Angel Moss, Angel Moss MR#:  [id number] DATE OF BIRTH:  Mar 31, 1960  DATE OF PROCEDURE:  04/14/2012  PREOPERATIVE DIAGNOSIS: Chronic cholecystitis, cholelithiasis.   POSTOPERATIVE DIAGNOSIS:  Chronic cholecystitis, cholelithiasis.  PROCEDURE: Laparoscopic cholecystectomy, cholangiogram.   SURGEON: [name], M.D.   ANESTHESIA: General.   INDICATIONS: This 53 year old female has a history of excruciating epigastric pains. She had a CT scan which demonstrated gallstones and surgery was recommended for definitive treatment.   DESCRIPTION OF PROCEDURE: The patient was placed on the operating table in the supine position under general anesthesia. The abdomen was prepared with ChloraPrep and draped in a sterile manner. A short incision was made in the inferior aspect of the umbilicus and carried down to the deep fascia which was grasped with laryngeal hook and elevated. A Veress needle was inserted, aspirated and irrigated with the saline solution. Next, the peritoneal cavity was inflated with carbon dioxide. The Veress needle was removed. The 10 mm cannula was inserted. The 10 mm, 0 degree laparoscope was inserted to view the peritoneal cavity. There was an appearance of a fatty liver with smooth surface.  Brief additional survey revealed no other abnormalities. Another incision was made in the epigastrium slightly to the right of the midline to introduce an 11 mm cannula. Two incisions were made in the lateral aspect of the right upper quadrant to introduce two 5-mm cannulas.   The gallbladder was retracted towards the right shoulder. The pouch of Randol Kern was retracted inferiorly and laterally. Dissection was carried out mobilizing the neck of the gallbladder with incision of the visceral peritoneum. The cystic duct was dissected free from surrounding structures. The cystic artery was dissected free from surrounding structures. Critical view of safety was demonstrated.  The common bile duct  was demonstrated. An Endo Clip was placed across the cystic duct adjacent to the neck of the gallbladder. An incision was made in the cystic duct to introduce a Reddick catheter. Half-strength Conray-60 dye was injected as the cholangiogram was done with fluoroscopy, viewing the biliary tree and flow of dye into the duodenum. No retained stones were seen the Reddick catheter was removed. The cystic duct was doubly ligated with Endo clips and divided. The cystic artery was controlled with double Endo clips and divided. One other small branch of the cystic artery was controlled with a single Endo Clip and divided. The gallbladder was dissected free from the liver with hook and cautery. There was no bleeding from this portion of the procedure.  Hemostasis was intact. The gallbladder was delivered up through the infraumbilical incision, opened and suctioned. There was some leakage of bile out of the gallbladder which was aspirated.  Stone forceps and stone scoop were used to remove multiple stones, There were several stones that fell out of the gallbladder onto the omentum and as the gallbladder was removed, these were retrieved with the stone scoop.  All stones were removed. The site was suctioned. No bile remained.  The right upper quadrant was further inspected. Cannulas were removed.   Carbon dioxide was allowed to escape from the peritoneal cavity. The fascial defect at the umbilicus was closed with a 0 Vicryl figure-of-eight suture. The skin incisions were closed with interrupted 5-0 chromic subcuticular sutures, benzoin and Steri-Strips. Dressings were applied with paper tape. The patient tolerated surgery satisfactorily and was prepared for transfer to the recovery room.    ____________________________ Lenna Sciara. [name], MD jws:ct D: 04/14/2012 10:42:47 ET T: 04/14/2012 13:15:48 ET JOB#: [id number]  cc: J.  [name], MD, <Dictator> Loreli Dollar MD ELECTRONICALLY SIGNED 04/14/2012 19:20

## 2014-10-02 ENCOUNTER — Ambulatory Visit: Payer: Self-pay | Admitting: Obstetrics and Gynecology

## 2014-10-15 ENCOUNTER — Other Ambulatory Visit: Payer: Self-pay | Admitting: Internal Medicine

## 2014-10-15 DIAGNOSIS — R1030 Lower abdominal pain, unspecified: Secondary | ICD-10-CM

## 2014-10-23 ENCOUNTER — Ambulatory Visit
Admission: RE | Admit: 2014-10-23 | Discharge: 2014-10-23 | Disposition: A | Discharge status: Home / Self Care | Payer: Medicare Other | Source: Ambulatory Visit | Attending: Internal Medicine | Admitting: Internal Medicine

## 2014-10-23 DIAGNOSIS — K429 Umbilical hernia without obstruction or gangrene: Secondary | ICD-10-CM | POA: Diagnosis not present

## 2014-10-23 DIAGNOSIS — I7090 Unspecified atherosclerosis: Secondary | ICD-10-CM | POA: Insufficient documentation

## 2014-10-23 DIAGNOSIS — R1030 Lower abdominal pain, unspecified: Secondary | ICD-10-CM | POA: Diagnosis present

## 2014-10-23 HISTORY — DX: Essential (primary) hypertension: I10

## 2014-10-23 MED ORDER — IOHEXOL 300 MG/ML  SOLN
100.0000 mL | Freq: Once | INTRAMUSCULAR | Status: AC | PRN
  Administered 2014-10-23: 100 mL via INTRAVENOUS

## 2014-11-13 ENCOUNTER — Other Ambulatory Visit: Payer: Self-pay | Admitting: Internal Medicine

## 2014-11-13 DIAGNOSIS — Z1231 Encounter for screening mammogram for malignant neoplasm of breast: Secondary | ICD-10-CM

## 2014-11-19 ENCOUNTER — Ambulatory Visit
Admission: RE | Admit: 2014-11-19 | Discharge: 2014-11-19 | Disposition: A | Discharge status: Home / Self Care | Payer: Medicare Other | Source: Ambulatory Visit | Attending: Internal Medicine | Admitting: Internal Medicine

## 2014-11-19 ENCOUNTER — Encounter: Payer: Self-pay | Admitting: Obstetrics and Gynecology

## 2014-11-19 ENCOUNTER — Ambulatory Visit (INDEPENDENT_AMBULATORY_CARE_PROVIDER_SITE_OTHER): Payer: Medicare Other | Admitting: Obstetrics and Gynecology

## 2014-11-19 ENCOUNTER — Other Ambulatory Visit: Payer: Self-pay | Admitting: Internal Medicine

## 2014-11-19 VITALS — BP 122/78 | HR 69 | Ht 66.0 in | Wt 209.0 lb

## 2014-11-19 DIAGNOSIS — Z1231 Encounter for screening mammogram for malignant neoplasm of breast: Secondary | ICD-10-CM

## 2014-11-19 DIAGNOSIS — N949 Unspecified condition associated with female genital organs and menstrual cycle: Secondary | ICD-10-CM | POA: Diagnosis not present

## 2014-11-19 DIAGNOSIS — R1031 Right lower quadrant pain: Secondary | ICD-10-CM | POA: Diagnosis not present

## 2014-11-19 DIAGNOSIS — G8929 Other chronic pain: Secondary | ICD-10-CM

## 2014-11-19 DIAGNOSIS — R102 Pelvic and perineal pain: Secondary | ICD-10-CM

## 2014-11-19 DIAGNOSIS — Z8742 Personal history of other diseases of the female genital tract: Secondary | ICD-10-CM | POA: Diagnosis not present

## 2014-11-19 NOTE — Progress Notes (Signed)
Patient ID: Angel Moss, female   DOB: 1960/11/18, 54 y.o.   MRN: [id number] Pelvic pain  ? Ovarian cyst  Chief complaint: 1.  Chronic pelvic pain, right lower quadrant  2.  History of ovarian cysts   The patient presents for follow-up on intermittent chronic right lower quadrant pain which has been affecting activities of daily living.  Patient has ventral wall hernia On the right side of abdomen.    Recent CT (10/23/2014) scan demonstrated no adnexal abnormality including no evidence of ovarian cysts;There is a right paramedian umbilical hernia measuring 8 x 5 x 6 cm containing omental fat.  A few colonic diverticula are noted.  Appendix was normal.  Past medical history, past surgical history, problem list, medications, and allergies are reviewed and updated.  Review of Systems  Constitutional: Negative.   Gastrointestinal: Positive for abdominal pain. Negative for nausea, vomiting, diarrhea, constipation and melena.  Genitourinary: Negative for dysuria, urgency, hematuria and flank pain.  Skin: Negative.     OBJECTIVE: BP 122/78 mmHg  Pulse 69  Ht 5\' 6"  (1.676 m)  Wt 209 lb (94.802 kg)  BMI 33.75 kg/m2 Pleasant, well-appearing white female in no acute distress. Neck: No CVA tenderness. Abdomen: Soft, no palpable masses; no peritoneal signs; right ventral wall hernia peri umbilical noted. Pelvic: External genitalia-normal BUS-normal. Vagina-mild atrophic changes; no lesions. Cervix-surgically absent Uterus-surgically absent. Bimanual exam-no palpable mass or significant tenderness. Rectovaginal-normal external exam. Extremities: Without clubbing, cyanosis or edema.  IMPRESSION: 1.  Chronic pelvic pain, right-sided. 2.  History of ovarian cysts; CT scan on 10/23/2014.  Does not demonstrate any adnexal pathology. 3.  Large abdominal wall hernia, periumbilical.  PLAN: 1.  Patient is given reassurance that no palpable abnormality is identified on clinical exam. 2.  Pelvic  ultrasound. 3.  Patient is to follow up after pelvic ultrasound for further management planning  Brayton Mars, MD

## 2014-11-20 ENCOUNTER — Inpatient Hospital Stay: Admission: RE | Admit: 2014-11-20 | Payer: Medicare Other | Source: Ambulatory Visit

## 2014-11-20 DIAGNOSIS — J45909 Unspecified asthma, uncomplicated: Secondary | ICD-10-CM | POA: Insufficient documentation

## 2014-11-20 DIAGNOSIS — Z86010 Personal history of colonic polyps: Secondary | ICD-10-CM | POA: Insufficient documentation

## 2014-11-20 DIAGNOSIS — F329 Major depressive disorder, single episode, unspecified: Secondary | ICD-10-CM | POA: Insufficient documentation

## 2014-11-20 DIAGNOSIS — F32A Depression, unspecified: Secondary | ICD-10-CM | POA: Insufficient documentation

## 2014-11-20 DIAGNOSIS — Z87898 Personal history of other specified conditions: Secondary | ICD-10-CM | POA: Insufficient documentation

## 2014-11-22 LAB — URINE CULTURE

## 2014-11-26 ENCOUNTER — Encounter: Payer: Self-pay | Admitting: *Deleted

## 2014-11-26 ENCOUNTER — Other Ambulatory Visit: Payer: Medicare Other

## 2014-11-29 ENCOUNTER — Ambulatory Visit: Admit: 2014-11-29 | Payer: Self-pay | Admitting: Surgery

## 2014-11-29 SURGERY — REPAIR, HERNIA, UMBILICAL, ADULT
Anesthesia: Choice

## 2014-12-09 ENCOUNTER — Ambulatory Visit: Payer: Self-pay | Admitting: General Surgery

## 2014-12-17 ENCOUNTER — Ambulatory Visit: Payer: Self-pay | Admitting: General Surgery

## 2014-12-17 ENCOUNTER — Ambulatory Visit: Payer: Medicare Other | Admitting: Obstetrics and Gynecology

## 2015-02-12 ENCOUNTER — Encounter: Payer: Self-pay | Admitting: *Deleted

## 2015-05-15 ENCOUNTER — Encounter: Payer: Self-pay | Admitting: General Surgery

## 2015-05-15 ENCOUNTER — Ambulatory Visit (INDEPENDENT_AMBULATORY_CARE_PROVIDER_SITE_OTHER): Payer: Medicare Other | Admitting: General Surgery

## 2015-05-15 VITALS — BP 144/90 | HR 76 | Resp 14 | Ht 66.0 in | Wt 216.0 lb

## 2015-05-15 DIAGNOSIS — K439 Ventral hernia without obstruction or gangrene: Secondary | ICD-10-CM | POA: Diagnosis not present

## 2015-05-15 NOTE — Progress Notes (Signed)
Patient ID: Angel Moss, female   DOB: [birth year], 55 y.o.   MRN: [id number]  Chief Complaint  Patient presents with  . Hernia    HPI TAIAH Angel Moss is a 55 y.o. female.  Here today for evaluation of a possible umbilical hernia. She first noticed the umbilical bulge about a year ago. She also has some abdominal pain  Radiating to the right flank area as well. 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 Her father  has since passed, but her mother is now developing Alzheimer's. She reports minimal assistance from her siblings. She is being quite careful about lifting.. Bowels move daily and are regular.   She is also having some lower back pain as well.   She has a rash under her abdominal panniculus that comes and goes, and it had been suggested that she consider having the pannus surgically removed.  CT scan 05-07-15. She did see Dr. Nicholes Stairs but elected to obtain a second opinion. Dr. Tamala Julian had completed her cholecystectomy in January 2014.  I personally reviewed the patient's history.   HPI  Past Medical History  Diagnosis Date  . Obesity   . Anxiety   . Mitral valve prolapse   . Sleep apnea   . History of kidney stones   . Hyperlipidemia   . Hypertension   . COPD (chronic obstructive pulmonary disease) ([place])   . Depression   . Ovarian cyst   . Hirsutism   . Cervical cancer ([place]) 1989    Past Surgical History  Procedure Laterality Date  . Kidney stone surgery Left 11/23/2011      1.3 cm left superior pole stone removed via nephrolithotomy by [name], M.D.  . Colonoscopy      precancerous polyp  . Cervical cone biopsy    . Tonsillectomy and adenoidectomy    . Vaginal hysterectomy  1989    partial  . Colonoscopy  2015  . Cholecystectomy  04/14/2012    Completed by [name], M.D.: Chronic cholecystitis and cholelithiasis    Family History  Problem Relation Age of Onset  . Hypertension Mother   . Heart disease Father   .  Cancer Paternal Grandmother     breast  . Breast cancer Paternal Grandmother   . Diabetes Neg Hx     Social History Social History  Substance Use Topics  . Smoking status: Current Every Day Smoker -- 0.25 packs/day for 10 years    Types: Cigarettes  . Smokeless tobacco: Never Used  . Alcohol Use: No    Allergies  Allergen Reactions  . Hydrocodone Nausea And Vomiting and Nausea Only    Current Outpatient Prescriptions  Medication Sig Dispense Refill  . albuterol (PROVENTIL HFA;VENTOLIN HFA) 108 (90 Base) MCG/ACT inhaler Inhale into the lungs every 6 (six) hours as needed for wheezing or shortness of breath.    . ALPRAZolam (XANAX) 0.5 MG tablet Take 0.5 mg by mouth 2 (two) times daily as needed for anxiety.    Marland Kitchen aspirin EC 81 MG tablet Take by mouth.    . Biotin 1000 MCG tablet Take 1,000 mcg by mouth 2 (two) times daily.    Marland Kitchen esomeprazole (NEXIUM) 40 MG capsule TAKE ONE CAPSULE BY MOUTH TWICE DAILY 180 capsule 0  . fluticasone (FLOVENT HFA) 110 MCG/ACT inhaler Inhale 2 puffs into the lungs 2 (two) times daily.      . hydrochlorothiazide (HYDRODIURIL) 25 MG tablet Take by mouth.    Marland Kitchen  metoprolol succinate (TOPROL-XL) 50 MG 24 hr tablet TAKE ONE TABLET BY MOUTH ONCE DAILY FOLLOWING A MEAL 90 tablet 1  . nystatin (MYCOSTATIN) powder     . rosuvastatin (CRESTOR) 20 MG tablet Take 1 tablet (20 mg total) by mouth daily. 30 tablet 5  . sertraline (ZOLOFT) 100 MG tablet Take 100 mg by mouth 2 (two) times daily.      No current facility-administered medications for this visit.    Review of Systems Review of Systems  Constitutional: Negative.   Respiratory: Negative.   Cardiovascular: Negative.   Gastrointestinal: Positive for abdominal pain. Negative for nausea, vomiting, diarrhea and constipation.    Blood pressure 144/90, pulse 76, resp. rate 14, height 5\' 6"  (1.676 m), weight 216 lb (97.977 kg).  Physical Exam Physical Exam  Constitutional: She is oriented to person, place,  and time. She appears well-developed and well-nourished.  Eyes: Conjunctivae are normal. No scleral icterus.  Neck: Neck supple.  Cardiovascular: Normal rate, regular rhythm and normal heart sounds.   Pulmonary/Chest: Effort normal and breath sounds normal.  Abdominal: Soft. Normal appearance and bowel sounds are normal. There is no hepatomegaly. There is no tenderness. A hernia ( umbillical hernia) is present.    Lymphadenopathy:    She has no cervical adenopathy.       Right: No inguinal and no supraclavicular adenopathy present.       Left: No inguinal and no supraclavicular adenopathy present.  Neurological: She is alert and oriented to person, place, and time.  Skin: Skin is warm and dry.    Data Reviewed CT scan of the abdomen and pelvis dated 10/23/2014 was reviewed. The study was completed for right lower quadrant and left lower quadrant and periumbilical pain. The study was notable for a IV 6 x 8 cm paraumbilical hernia inferior to the umbilicus containing omentum. No evidence of appendicitis. Previous cholecystectomy. Colonic diverticuli without evidence of inflammation. No recurrent left renal calyx stone [place] status post nephrolithotomy 2013)  Independent review of the films shows a 2 x 4 cm transversely oriented fascial defect with a large amount of entrapped omentum.  PCP notes of 10/09/2014 reviewed. Patient was seen for abdominal pain at that time. Reported a significant amount of stress with panic attacks. Laboratory studies at that time showed hemoglobin of 14.1, white blood cell count of 6400, MCV of 81, platelet count of 141,000, normal, ansa metabolic panel and electrolytes.  Assessment    Ventral hernia, possibly related to previous cholecystectomy incision with entrapped omentum.    Plan    The patient does not report much discomfort at the site, and with the large volume of omentum stuffed into a modest size fascial defect, I believe her risk for intestinal  incarceration is 0. She was reluctant to consider surgical therapy at this time, and I told her that surgical repair was not mandatory at this time. I don't have a clear source for her intermittent lower abdominal tenderness, adequately evaluated by CT just 6 months ago.      Hernia precautions and incarceration were discussed with the patient. If they develop symptoms of an incarcerated hernia, they were encouraged to seek prompt medical attention.  The patient was encouraged to call the office if she desired to proceed to elective repair. The possible use of mesh depending upon the final fascial defect size and clinical evaluation during the time of surgery was discussed. The risk of infection was reviewed. The role of prosthetic mesh to minimize the risk of  recurrence was reviewed. PCP/Ref:  Fulton Reek D  Patient to return when ready to have umbilical hernia repair.  This information has been scribed by Karie Fetch RNBC.   [name] 05/16/2015, 5:49 PM  The patient contacted the office on 05/15/2005 reporting a desire to proceed with hernia repair. This will be scheduled convenient date.

## 2015-05-15 NOTE — Patient Instructions (Signed)
Hernia, Adult A hernia is the bulging of an organ or tissue through a weak spot in the muscles of the abdomen (abdominal wall). Hernias develop most often near the navel or groin. There are many kinds of hernias. Common kinds include:  Femoral hernia. This kind of hernia develops under the groin in the upper thigh area.  Inguinal hernia. This kind of hernia develops in the groin or scrotum.  Umbilical hernia. This kind of hernia develops near the navel.  Hiatal hernia. This kind of hernia causes part of the stomach to be pushed up into the chest.  Incisional hernia. This kind of hernia bulges through a scar from an abdominal surgery. CAUSES This condition may be caused by:  Heavy lifting.  Coughing over a long period of time.  Straining to have a bowel movement.  An incision made during an abdominal surgery.  A birth defect (congenital defect).  Excess weight or obesity.  Smoking.  Poor nutrition.  Cystic fibrosis.  Excess fluid in the abdomen.  Undescended testicles. SYMPTOMS Symptoms of a hernia include:  A lump on the abdomen. This is the first sign of a hernia. The lump may become more obvious with standing, straining, or coughing. It may get bigger over time if it is not treated or if the condition causing it is not treated.  Pain. A hernia is usually painless, but it may become painful over time if treatment is delayed. The pain is usually dull and may get worse with standing or lifting heavy objects. Sometimes a hernia gets tightly squeezed in the weak spot (strangulated) or stuck there (incarcerated) and causes additional symptoms. These symptoms may include:  Vomiting.  Nausea.  Constipation.  Irritability. DIAGNOSIS A hernia may be diagnosed with:  A physical exam. During the exam your health care provider may ask you to cough or to make a specific movement, because a hernia is usually more visible when you move.  Imaging tests. These can  include:  X-rays.  Ultrasound.  CT scan. TREATMENT A hernia that is small and painless may not need to be treated. A hernia that is large or painful may be treated with surgery. Inguinal hernias may be treated with surgery to prevent incarceration or strangulation. Strangulated hernias are always treated with surgery, because lack of blood to the trapped organ or tissue can cause it to die. Surgery to treat a hernia involves pushing the bulge back into place and repairing the weak part of the abdomen. HOME CARE INSTRUCTIONS  Avoid straining.  Do not lift anything heavier than 10 lb (4.5 kg).  Lift with your leg muscles, not your back muscles. This helps avoid strain.  When coughing, try to cough gently.  Prevent constipation. Constipation leads to straining with bowel movements, which can make a hernia worse or cause a hernia repair to break down. You can prevent constipation by:  Eating a high-fiber diet that includes plenty of fruits and vegetables.  Drinking enough fluids to keep your urine clear or pale yellow. Aim to drink 6-8 glasses of water per day.  Using a stool softener as directed by your health care provider.  Lose weight, if you are overweight.  Do not use any tobacco products, including cigarettes, chewing tobacco, or electronic cigarettes. If you need help quitting, ask your health care provider.  Keep all follow-up visits as directed by your health care provider. This is important. Your health care provider may need to monitor your condition. SEEK MEDICAL CARE IF:  You have   swelling, redness, and pain in the affected area.  Your bowel habits change. SEEK IMMEDIATE MEDICAL CARE IF:  You have a fever.  You have abdominal pain that is getting worse.  You feel nauseous or you vomit.  You cannot push the hernia back in place by gently pressing on it while you are lying down.  The hernia:  Changes in shape or size.  Is stuck outside the  abdomen.  Becomes discolored.  Feels hard or tender.   This information is not intended to replace advice given to you by your health care provider. Make sure you discuss any questions you have with your health care provider.   Document Released: 03/01/2005 Document Revised: 03/22/2014 Document Reviewed: 01/09/2014 Elsevier Interactive Patient Education 2016 [employer]  

## 2015-05-16 ENCOUNTER — Encounter: Payer: Self-pay | Admitting: General Surgery

## 2015-05-16 ENCOUNTER — Telehealth: Payer: Self-pay | Admitting: General Surgery

## 2015-05-16 DIAGNOSIS — K439 Ventral hernia without obstruction or gangrene: Secondary | ICD-10-CM | POA: Insufficient documentation

## 2015-05-16 DIAGNOSIS — K429 Umbilical hernia without obstruction or gangrene: Secondary | ICD-10-CM | POA: Insufficient documentation

## 2015-05-16 NOTE — Telephone Encounter (Signed)
PT CALLED TO ASK YOUR ADVISE,SINCE HER VISIT ON 05-16-15 SHE HAS BEEN IN PAIN FROM HER UMBILICAL AREA TO HER RT SIDE & BACK.PLEASE CALL./MTH

## 2015-05-16 NOTE — Telephone Encounter (Signed)
Patient want to scheduled surgery for her umbilical hernia repair now.  Patient may take tylenol, advil, or aleve as needed for pain or discomfort.The patient is aware to use a heating pad as needed for comfort.

## 2015-05-19 ENCOUNTER — Other Ambulatory Visit: Payer: Self-pay | Admitting: *Deleted

## 2015-05-19 DIAGNOSIS — E669 Obesity, unspecified: Secondary | ICD-10-CM

## 2015-05-19 NOTE — Telephone Encounter (Signed)
Patient's surgery has been scheduled for 06-02-15 at [facility] [place].  This patient reports her pain is better.   Also, requesting referral to Dr. Nicholaus Bloom for tummy tuck.

## 2015-05-21 ENCOUNTER — Other Ambulatory Visit: Payer: Self-pay | Admitting: General Surgery

## 2015-05-21 DIAGNOSIS — K439 Ventral hernia without obstruction or gangrene: Secondary | ICD-10-CM

## 2015-05-21 NOTE — H&P (Signed)
HPI Angel Moss is a 55 y.o. female. Here today for evaluation of a possible umbilical hernia. She first noticed the umbilical bulge about a year ago. She also has some abdominal pain Radiating to the right flank area as well. 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 Her father has since passed, but her mother is now developing Alzheimer's. She reports minimal assistance from her siblings. She is being quite careful about lifting.. Bowels move daily and are regular.   She is also having some lower back pain as well.   She has a rash under her abdominal panniculus that comes and goes, and it had been suggested that she consider having the pannus surgically removed.  CT scan 05-07-15. She did see Dr. Nicholes Stairs but elected to obtain a second opinion. Dr. Tamala Julian had completed her cholecystectomy in January 2014.  I personally reviewed the patient's history.   HPI  Past Medical History  Diagnosis Date  . Obesity   . Anxiety   . Mitral valve prolapse   . Sleep apnea   . History of kidney stones   . Hyperlipidemia   . Hypertension   . COPD (chronic obstructive pulmonary disease) ([place])   . Depression   . Ovarian cyst   . Hirsutism   . Cervical cancer ([place]) 1989    Past Surgical History  Procedure Laterality Date  . Kidney stone surgery Left 11/23/2011     1.3 cm left superior pole stone removed via nephrolithotomy by [name], M.D.  . Colonoscopy      precancerous polyp  . Cervical cone biopsy    . Tonsillectomy and adenoidectomy    . Vaginal hysterectomy  1989    partial  . Colonoscopy  2015  . Cholecystectomy  04/14/2012    Completed by [name], M.D.: Chronic cholecystitis and cholelithiasis    Family History  Problem Relation Age of Onset  . Hypertension Mother   . Heart disease Father   . Cancer Paternal Grandmother      breast  . Breast cancer Paternal Grandmother   . Diabetes Neg Hx     Social History Social History  Substance Use Topics  . Smoking status: Current Every Day Smoker -- 0.25 packs/day for 10 years    Types: Cigarettes  . Smokeless tobacco: Never Used  . Alcohol Use: No    Allergies  Allergen Reactions  . Hydrocodone Nausea And Vomiting and Nausea Only    Current Outpatient Prescriptions  Medication Sig Dispense Refill  . albuterol (PROVENTIL HFA;VENTOLIN HFA) 108 (90 Base) MCG/ACT inhaler Inhale into the lungs every 6 (six) hours as needed for wheezing or shortness of breath.    . ALPRAZolam (XANAX) 0.5 MG tablet Take 0.5 mg by mouth 2 (two) times daily as needed for anxiety.    Marland Kitchen aspirin EC 81 MG tablet Take by mouth.    . Biotin 1000 MCG tablet Take 1,000 mcg by mouth 2 (two) times daily.    Marland Kitchen esomeprazole (NEXIUM) 40 MG capsule TAKE ONE CAPSULE BY MOUTH TWICE DAILY 180 capsule 0  . fluticasone (FLOVENT HFA) 110 MCG/ACT inhaler Inhale 2 puffs into the lungs 2 (two) times daily.     . hydrochlorothiazide (HYDRODIURIL) 25 MG tablet Take by mouth.    . metoprolol succinate (TOPROL-XL) 50 MG 24 hr tablet TAKE ONE TABLET BY MOUTH ONCE DAILY FOLLOWING A MEAL 90 tablet 1  . nystatin (MYCOSTATIN) powder     .  rosuvastatin (CRESTOR) 20 MG tablet Take 1 tablet (20 mg total) by mouth daily. 30 tablet 5  . sertraline (ZOLOFT) 100 MG tablet Take 100 mg by mouth 2 (two) times daily.      No current facility-administered medications for this visit.    Review of Systems Review of Systems  Constitutional: Negative.  Respiratory: Negative.  Cardiovascular: Negative.  Gastrointestinal: Positive for abdominal pain. Negative for nausea, vomiting, diarrhea and constipation.    Blood pressure 144/90, pulse 76, resp. rate 14, height 5\' 6"  (1.676 m), weight 216 lb (97.977 kg).  Physical  Exam Physical Exam  Constitutional: She is oriented to person, place, and time. She appears well-developed and well-nourished.  Eyes: Conjunctivae are normal. No scleral icterus.  Neck: Neck supple.  Cardiovascular: Normal rate, regular rhythm and normal heart sounds.  Pulmonary/Chest: Effort normal and breath sounds normal.  Abdominal: Soft. Normal appearance and bowel sounds are normal. There is no hepatomegaly. There is no tenderness. A hernia ( umbillical hernia) is present.    Lymphadenopathy:   She has no cervical adenopathy.   Right: No inguinal and no supraclavicular adenopathy present.   Left: No inguinal and no supraclavicular adenopathy present.  Neurological: She is alert and oriented to person, place, and time.  Skin: Skin is warm and dry.    Data Reviewed CT scan of the abdomen and pelvis dated 10/23/2014 was reviewed. The study was completed for right lower quadrant and left lower quadrant and periumbilical pain. The study was notable for a IV 6 x 8 cm paraumbilical hernia inferior to the umbilicus containing omentum. No evidence of appendicitis. Previous cholecystectomy. Colonic diverticuli without evidence of inflammation. No recurrent left renal calyx stone [place] status post nephrolithotomy 2013)  Independent review of the films shows a 2 x 4 cm transversely oriented fascial defect with a large amount of entrapped omentum.  PCP notes of 10/09/2014 reviewed. Patient was seen for abdominal pain at that time. Reported a significant amount of stress with panic attacks. Laboratory studies at that time showed hemoglobin of 14.1, white blood cell count of 6400, MCV of 81, platelet count of 141,000, normal, ansa metabolic panel and electrolytes.  Assessment    Ventral hernia, possibly related to previous cholecystectomy incision with entrapped omentum.    Plan    The patient does not report much discomfort at the site, and with the large volume of omentum stuffed into  a modest size fascial defect, I believe her risk for intestinal incarceration is 0. She was reluctant to consider surgical therapy at this time, and I told her that surgical repair was not mandatory at this time. I don't have a clear source for her intermittent lower abdominal tenderness, adequately evaluated by CT just 6 months ago.      Hernia precautions and incarceration were discussed with the patient. If they develop symptoms of an incarcerated hernia, they were encouraged to seek prompt medical attention.  The patient was encouraged to call the office if she desired to proceed to elective repair. The possible use of mesh depending upon the final fascial defect size and clinical evaluation during the time of surgery was discussed. The risk of infection was reviewed. The role of prosthetic mesh to minimize the risk of recurrence was reviewed. PCP/Ref: Fulton Reek D  Patient to return when ready to have umbilical hernia repair.  This information has been scribed by Karie Fetch RNBC.   [name] 05/16/2015, 5:49 PM  The patient contacted the office on 05/15/2005  reporting a desire to proceed with hernia repair. This will be scheduled convenient date.

## 2015-05-26 ENCOUNTER — Encounter: Payer: Self-pay | Admitting: *Deleted

## 2015-05-26 ENCOUNTER — Other Ambulatory Visit: Payer: Medicare Other

## 2015-05-26 NOTE — Pre-Procedure Instructions (Signed)
PT INFORMED THAT BECAUSE OF HER MVP AND HTN THAT ANESTHESIA REQUIRES AN EKG- THE LAST EKG IS FROM 2015-PT LIVES 1.5 HOURS AWAY AND STATES THAT SHE WOULD RATHER DO EKG THE AM OF SURGERY-I INFORMED HER THAT IF HER EKG IS ABNORMAL THAT DAY ANESTHESIA COULD CANCEL HER SURGERY. PT STATES SHE IS AWARE OF THIS AND IS PERFECTLY FINE STILL WITH DOING AN EKG THE AM OF SURGERY

## 2015-05-26 NOTE — Patient Instructions (Addendum)
  Your procedure is scheduled on: 06-02-15  Report to [facility] To find out your arrival time please call [phone] between 1PM - 3PM on 05-30-15   Remember: Instructions that are not followed completely may result in serious medical risk, up to and including death, or upon the discretion of your surgeon and anesthesiologist your surgery may need to be rescheduled.    _X___ 1. Do not eat food or drink liquids after midnight. No gum chewing or hard candies.     _X___ 2. No Alcohol for 24 hours before or after surgery.   ____ 3. Bring all medications with you on the day of surgery if instructed.    ____ 4. Notify your doctor if there is any change in your medical condition     (cold, fever, infections).     Do not wear jewelry, make-up, hairpins, clips or nail polish.  Do not wear lotions, powders, or perfumes. You may wear deodorant.  Do not shave 48 hours prior to surgery. Men may shave face and neck.  Do not bring valuables to the hospital.    [facility] is not responsible for any belongings or valuables.               Contacts, dentures or bridgework may not be worn into surgery.  Leave your suitcase in the car. After surgery it may be brought to your room.  For patients admitted to the hospital, discharge time is determined by your  treatment team.   Patients discharged the day of surgery will not be allowed to drive home.   Please read over the following fact sheets that you were given:      __X__ Take these medicines the morning of surgery with A SIP OF WATER:    1. METOPROLOL  2. [name]  3. XANAX  4. ZOLOFT  5.  6.  ____ Fleet Enema (as directed)   ____ Use CHG Soap as directed  _X___ Use inhalers on the day of surgery-USE ALBUTEROL INHALER AND BRING TO Angel Moss  ____ Stop metformin 2 days prior to surgery    ____ Take 1/2 of usual insulin dose the night before surgery and none on the morning of surgery.   ____ Stop  Coumadin/Plavix/aspirin-OK TO CONTINUE 81 MG ASA  ____ Stop Anti-inflammatories    ____ Stop supplements until after surgery-STOP BIOTIN NOW  ____ Bring C-Pap to the hospital.

## 2015-05-27 ENCOUNTER — Telehealth: Payer: Self-pay | Admitting: *Deleted

## 2015-05-27 NOTE — Telephone Encounter (Signed)
Patient called and wanted to ask you if it was ok to ride back home (one hour away) after her surgery is completed? Patient's surgery is scheduled for 06-02-15 at [facility]. She wants to make sure it is ok to be in the car that long after her surgery.

## 2015-05-27 NOTE — Telephone Encounter (Signed)
Not a problem.

## 2015-05-28 NOTE — Telephone Encounter (Signed)
Advised patient and she verbalized understanding. Appreciative of your advice.

## 2015-06-02 ENCOUNTER — Ambulatory Visit: Payer: Medicare Other | Admitting: Anesthesiology

## 2015-06-02 ENCOUNTER — Ambulatory Visit
Admission: RE | Admit: 2015-06-02 | Discharge: 2015-06-02 | Disposition: A | Discharge status: Home / Self Care | Payer: Medicare Other | Source: Ambulatory Visit | Attending: General Surgery | Admitting: General Surgery

## 2015-06-02 ENCOUNTER — Encounter: Payer: Self-pay | Admitting: *Deleted

## 2015-06-02 ENCOUNTER — Encounter
Admission: RE | Disposition: A | Discharge status: Home / Self Care | Payer: Self-pay | Source: Ambulatory Visit | Attending: General Surgery

## 2015-06-02 DIAGNOSIS — K439 Ventral hernia without obstruction or gangrene: Secondary | ICD-10-CM | POA: Insufficient documentation

## 2015-06-02 DIAGNOSIS — G4730 Sleep apnea, unspecified: Secondary | ICD-10-CM | POA: Diagnosis not present

## 2015-06-02 DIAGNOSIS — F329 Major depressive disorder, single episode, unspecified: Secondary | ICD-10-CM | POA: Diagnosis not present

## 2015-06-02 DIAGNOSIS — Z803 Family history of malignant neoplasm of breast: Secondary | ICD-10-CM | POA: Insufficient documentation

## 2015-06-02 DIAGNOSIS — Z7951 Long term (current) use of inhaled steroids: Secondary | ICD-10-CM | POA: Diagnosis not present

## 2015-06-02 DIAGNOSIS — Z8249 Family history of ischemic heart disease and other diseases of the circulatory system: Secondary | ICD-10-CM | POA: Diagnosis not present

## 2015-06-02 DIAGNOSIS — Z79899 Other long term (current) drug therapy: Secondary | ICD-10-CM | POA: Insufficient documentation

## 2015-06-02 DIAGNOSIS — Z885 Allergy status to narcotic agent status: Secondary | ICD-10-CM | POA: Insufficient documentation

## 2015-06-02 DIAGNOSIS — Z90710 Acquired absence of both cervix and uterus: Secondary | ICD-10-CM | POA: Diagnosis not present

## 2015-06-02 DIAGNOSIS — I341 Nonrheumatic mitral (valve) prolapse: Secondary | ICD-10-CM | POA: Diagnosis not present

## 2015-06-02 DIAGNOSIS — F17210 Nicotine dependence, cigarettes, uncomplicated: Secondary | ICD-10-CM | POA: Diagnosis not present

## 2015-06-02 DIAGNOSIS — I10 Essential (primary) hypertension: Secondary | ICD-10-CM | POA: Diagnosis not present

## 2015-06-02 DIAGNOSIS — Z833 Family history of diabetes mellitus: Secondary | ICD-10-CM | POA: Diagnosis not present

## 2015-06-02 DIAGNOSIS — Z6834 Body mass index (BMI) 34.0-34.9, adult: Secondary | ICD-10-CM | POA: Diagnosis not present

## 2015-06-02 DIAGNOSIS — Z87442 Personal history of urinary calculi: Secondary | ICD-10-CM | POA: Insufficient documentation

## 2015-06-02 DIAGNOSIS — Z7982 Long term (current) use of aspirin: Secondary | ICD-10-CM | POA: Diagnosis not present

## 2015-06-02 DIAGNOSIS — Z9049 Acquired absence of other specified parts of digestive tract: Secondary | ICD-10-CM | POA: Diagnosis not present

## 2015-06-02 DIAGNOSIS — E785 Hyperlipidemia, unspecified: Secondary | ICD-10-CM | POA: Diagnosis not present

## 2015-06-02 DIAGNOSIS — E669 Obesity, unspecified: Secondary | ICD-10-CM | POA: Insufficient documentation

## 2015-06-02 DIAGNOSIS — F419 Anxiety disorder, unspecified: Secondary | ICD-10-CM | POA: Insufficient documentation

## 2015-06-02 DIAGNOSIS — L680 Hirsutism: Secondary | ICD-10-CM | POA: Insufficient documentation

## 2015-06-02 DIAGNOSIS — E65 Localized adiposity: Secondary | ICD-10-CM | POA: Insufficient documentation

## 2015-06-02 DIAGNOSIS — Z9889 Other specified postprocedural states: Secondary | ICD-10-CM | POA: Diagnosis not present

## 2015-06-02 DIAGNOSIS — J449 Chronic obstructive pulmonary disease, unspecified: Secondary | ICD-10-CM | POA: Diagnosis not present

## 2015-06-02 HISTORY — PX: VENTRAL HERNIA REPAIR: SHX424

## 2015-06-02 HISTORY — DX: Other complications of anesthesia, initial encounter: T88.59XA

## 2015-06-02 HISTORY — DX: Other specified postprocedural states: Z98.890

## 2015-06-02 HISTORY — DX: Cardiac arrhythmia, unspecified: I49.9

## 2015-06-02 HISTORY — DX: Adverse effect of unspecified anesthetic, initial encounter: T41.45XA

## 2015-06-02 HISTORY — DX: Gastro-esophageal reflux disease without esophagitis: K21.9

## 2015-06-02 HISTORY — DX: Nausea with vomiting, unspecified: R11.2

## 2015-06-02 HISTORY — DX: Unspecified asthma, uncomplicated: J45.909

## 2015-06-02 HISTORY — PX: HERNIA REPAIR: SHX51

## 2015-06-02 SURGERY — REPAIR, HERNIA, VENTRAL
Anesthesia: General | Wound class: Clean

## 2015-06-02 MED ORDER — MIDAZOLAM HCL 2 MG/2ML IJ SOLN
INTRAMUSCULAR | Status: DC | PRN
  Administered 2015-06-02: 2 mg via INTRAVENOUS

## 2015-06-02 MED ORDER — ONDANSETRON 4 MG PO TBDP: 4.0000 mg | ORAL_TABLET | ORAL | Status: DC | PRN

## 2015-06-02 MED ORDER — DEXAMETHASONE SODIUM PHOSPHATE 10 MG/ML IJ SOLN
INTRAMUSCULAR | Status: DC | PRN
  Administered 2015-06-02: 10 mg via INTRAVENOUS

## 2015-06-02 MED ORDER — NEOSTIGMINE METHYLSULFATE 10 MG/10ML IV SOLN
INTRAVENOUS | Status: DC | PRN
  Administered 2015-06-02: 5 mg via INTRAVENOUS

## 2015-06-02 MED ORDER — LIDOCAINE HCL (CARDIAC) 20 MG/ML IV SOLN
INTRAVENOUS | Status: DC | PRN
  Administered 2015-06-02: 100 mg via INTRAVENOUS

## 2015-06-02 MED ORDER — FENTANYL CITRATE (PF) 100 MCG/2ML IJ SOLN
INTRAMUSCULAR | Status: DC | PRN
  Administered 2015-06-02: 100 ug via INTRAVENOUS

## 2015-06-02 MED ORDER — PROPOFOL 10 MG/ML IV BOLUS
INTRAVENOUS | Status: DC | PRN
  Administered 2015-06-02: 150 mg via INTRAVENOUS

## 2015-06-02 MED ORDER — ONDANSETRON HCL 4 MG/2ML IJ SOLN
4.0000 mg | Freq: Once | INTRAMUSCULAR | Status: AC | PRN
  Administered 2015-06-02: 4 mg via INTRAVENOUS

## 2015-06-02 MED ORDER — GLYCOPYRROLATE 0.2 MG/ML IJ SOLN
INTRAMUSCULAR | Status: DC | PRN
  Administered 2015-06-02: 0.6 mg via INTRAVENOUS

## 2015-06-02 MED ORDER — ACETAMINOPHEN 10 MG/ML IV SOLN
INTRAVENOUS | Status: AC
  Filled 2015-06-02: qty 100

## 2015-06-02 MED ORDER — ONDANSETRON HCL 4 MG/2ML IJ SOLN
INTRAMUSCULAR | Status: DC | PRN
  Administered 2015-06-02: 4 mg via INTRAVENOUS

## 2015-06-02 MED ORDER — ACETAMINOPHEN 10 MG/ML IV SOLN
INTRAVENOUS | Status: DC | PRN
  Administered 2015-06-02: 1000 mg via INTRAVENOUS

## 2015-06-02 MED ORDER — ROCURONIUM BROMIDE 100 MG/10ML IV SOLN
INTRAVENOUS | Status: DC | PRN
  Administered 2015-06-02: 30 mg via INTRAVENOUS

## 2015-06-02 MED ORDER — ONDANSETRON HCL 4 MG/2ML IJ SOLN
INTRAMUSCULAR | Status: AC
  Administered 2015-06-02: 4 mg via INTRAVENOUS
  Filled 2015-06-02: qty 2

## 2015-06-02 MED ORDER — CEFAZOLIN SODIUM-DEXTROSE 2-3 GM-% IV SOLR
2.0000 g | INTRAVENOUS | Status: AC
  Administered 2015-06-02: 2 g via INTRAVENOUS

## 2015-06-02 MED ORDER — FENTANYL CITRATE (PF) 100 MCG/2ML IJ SOLN
INTRAMUSCULAR | Status: AC
  Administered 2015-06-02: 25 ug via INTRAVENOUS
  Filled 2015-06-02: qty 2

## 2015-06-02 MED ORDER — LACTATED RINGERS IV SOLN
INTRAVENOUS | Status: DC
Start: 2015-06-02 — End: 2015-06-02
  Administered 2015-06-02 (×3): via INTRAVENOUS

## 2015-06-02 MED ORDER — CEFAZOLIN SODIUM-DEXTROSE 2-3 GM-% IV SOLR
INTRAVENOUS | Status: AC
  Administered 2015-06-02: 2 g via INTRAVENOUS
  Filled 2015-06-02: qty 50

## 2015-06-02 MED ORDER — BUPIVACAINE HCL (PF) 0.5 % IJ SOLN
INTRAMUSCULAR | Status: AC
  Filled 2015-06-02: qty 30

## 2015-06-02 MED ORDER — BUPIVACAINE HCL (PF) 0.5 % IJ SOLN
INTRAMUSCULAR | Status: DC | PRN
  Administered 2015-06-02: 30 mL

## 2015-06-02 MED ORDER — HYDROCODONE-ACETAMINOPHEN 5-325 MG PO TABS: 1.0000 | ORAL_TABLET | ORAL | Status: DC | PRN

## 2015-06-02 MED ORDER — FENTANYL CITRATE (PF) 100 MCG/2ML IJ SOLN
25.0000 ug | INTRAMUSCULAR | Status: AC | PRN
  Administered 2015-06-02 (×6): 25 ug via INTRAVENOUS

## 2015-06-02 MED ORDER — EPHEDRINE SULFATE 50 MG/ML IJ SOLN
INTRAMUSCULAR | Status: DC | PRN
  Administered 2015-06-02: 15 mg via INTRAVENOUS
  Administered 2015-06-02: 10 mg via INTRAVENOUS
  Administered 2015-06-02: 15 mg via INTRAVENOUS

## 2015-06-02 MED ORDER — SUCCINYLCHOLINE CHLORIDE 20 MG/ML IJ SOLN
INTRAMUSCULAR | Status: DC | PRN
  Administered 2015-06-02: 100 mg via INTRAVENOUS

## 2015-06-02 MED ORDER — HYDROCODONE-ACETAMINOPHEN 5-325 MG PO TABS
ORAL_TABLET | ORAL | Status: AC
Start: 2015-06-02 — End: 2015-06-02
  Administered 2015-06-02: 1 via ORAL
  Filled 2015-06-02: qty 1

## 2015-06-02 MED ORDER — HYDROCODONE-ACETAMINOPHEN 5-325 MG PO TABS
1.0000 | ORAL_TABLET | ORAL | Status: DC | PRN
  Administered 2015-06-02: 1 via ORAL

## 2015-06-02 SURGICAL SUPPLY — 36 items
BLADE SURG 15 STRL SS SAFETY (BLADE) ×3 IMPLANT
CANISTER SUCT 1200ML W/VALVE (MISCELLANEOUS) ×3 IMPLANT
CATH TRAY 16F METER LATEX (MISCELLANEOUS) IMPLANT
CHLORAPREP W/TINT 26ML (MISCELLANEOUS) ×3 IMPLANT
CLOSURE WOUND 1/2 X4 (GAUZE/BANDAGES/DRESSINGS) ×1
DRAIN CHANNEL JP 15F RND 16 (MISCELLANEOUS) IMPLANT
DRAPE CHEST BREAST 77X106 FENE (MISCELLANEOUS) IMPLANT
DRAPE LAPAROTOMY 100X77 ABD (DRAPES) ×3 IMPLANT
DRSG TEGADERM 4X4.75 (GAUZE/BANDAGES/DRESSINGS) ×3 IMPLANT
DRSG TELFA 3X8 NADH (GAUZE/BANDAGES/DRESSINGS) ×3 IMPLANT
ELECT REM PT RETURN 9FT ADLT (ELECTROSURGICAL) ×3
ELECTRODE REM PT RTRN 9FT ADLT (ELECTROSURGICAL) ×1 IMPLANT
GAUZE SPONGE 4X4 12PLY STRL (GAUZE/BANDAGES/DRESSINGS) IMPLANT
GLOVE BIO SURGEON STRL SZ7.5 (GLOVE) ×9 IMPLANT
GLOVE INDICATOR 8.0 STRL GRN (GLOVE) ×6 IMPLANT
GOWN STRL REUS W/ TWL LRG LVL3 (GOWN DISPOSABLE) ×2 IMPLANT
GOWN STRL REUS W/TWL LRG LVL3 (GOWN DISPOSABLE) ×4
KIT RM TURNOVER STRD PROC AR (KITS) ×3 IMPLANT
LABEL OR SOLS (LABEL) IMPLANT
MESH VENTRALEX ST 2.5 CRC MED (Mesh General) ×3 IMPLANT
NDL SAFETY 22GX1.5 (NEEDLE) ×3 IMPLANT
NEEDLE HYPO 25X1 1.5 SAFETY (NEEDLE) IMPLANT
NS IRRIG 500ML POUR BTL (IV SOLUTION) ×3 IMPLANT
PACK BASIN MINOR ARMC (MISCELLANEOUS) ×3 IMPLANT
SPONGE LAP 18X18 5 PK (GAUZE/BANDAGES/DRESSINGS) IMPLANT
STAPLER SKIN PROX 35W (STAPLE) IMPLANT
STRIP CLOSURE SKIN 1/2X4 (GAUZE/BANDAGES/DRESSINGS) ×2 IMPLANT
SUT SURGILON 0 BLK (SUTURE) ×6 IMPLANT
SUT VIC AB 2-0 BRD 54 (SUTURE) IMPLANT
SUT VIC AB 2-0 CT1 (SUTURE) ×3 IMPLANT
SUT VIC AB 3-0 SH 27 (SUTURE) ×2
SUT VIC AB 3-0 SH 27X BRD (SUTURE) ×1 IMPLANT
SUT VIC AB 4-0 FS2 27 (SUTURE) ×3 IMPLANT
SUT VICRYL+ 3-0 144IN (SUTURE) ×3 IMPLANT
SYR 3ML LL SCALE MARK (SYRINGE) IMPLANT
SYR CONTROL 10ML (SYRINGE) ×3 IMPLANT

## 2015-06-02 NOTE — Transfer of Care (Signed)
Immediate Anesthesia Transfer of Care Note  Patient: Angel Moss  Procedure(s) Performed: Procedure(s): HERNIA REPAIR VENTRAL ADULT (N/A)  Patient Location: PACU  Anesthesia Type:General  Level of Consciousness: awake  Airway & Oxygen Therapy: Patient Spontanous Breathing  Post-op Assessment: Report given to RN  Post vital signs: Reviewed  Last Vitals:  Filed Vitals:   06/02/15 1152 06/02/15 1340  BP: 134/90 148/83  Pulse: 69 78  Temp: 36.7 C 36.7 C  Resp: 14 15    Complications: No apparent anesthesia complications

## 2015-06-02 NOTE — H&P (Signed)
No change in clinical history or exam. For ventral hernia repair with prosthetic mesh if needed.

## 2015-06-02 NOTE — Anesthesia Postprocedure Evaluation (Signed)
Anesthesia Post Note  Patient: Angel Moss  Procedure(s) Performed: Procedure(s) (LRB): HERNIA REPAIR VENTRAL ADULT (N/A)  Patient location during evaluation: PACU Anesthesia Type: General Level of consciousness: awake and alert Pain management: pain level controlled Vital Signs Assessment: post-procedure vital signs reviewed and stable Respiratory status: spontaneous breathing, nonlabored ventilation, respiratory function stable and patient connected to nasal cannula oxygen Cardiovascular status: blood pressure returned to baseline and stable Postop Assessment: no signs of nausea or vomiting Anesthetic complications: no    Last Vitals:  Filed Vitals:   06/02/15 1440 06/02/15 1509  BP: 100/63 114/60  Pulse: 59 63  Temp:    Resp: 16 16    Last Pain:  Filed Vitals:   06/02/15 1511  PainSc: [place]

## 2015-06-02 NOTE — OR Nursing (Signed)
IV right forearm 22 ga. Site clear. No edema or redness.

## 2015-06-02 NOTE — OR Nursing (Signed)
IV removed. IV site clear. No edema or redness.

## 2015-06-02 NOTE — Op Note (Signed)
Preoperative diagnosis: Ventral hernia with incarcerated omentum.  Postoperative diagnosis: Same.  Operative procedure: Ventral hernia repair with Ventralite ST mesh.  Operative surgeon: Ollen Bowl, M.D.  Anesthesia:: Gen. endotracheal, Marcaine 0.5% plain, 30 mL local infiltration.  Estimate blood loss: Minimal.  Clinical note: This 55 year old woman has developed a midline hernia at the site of a previous laparoscopic port. She has a large 8 cm mass of omentum incarcerated in the area. It become increasing symptomatic with local discomfort. No obstructive symptoms. She was admitted for elective repair. Her risk factors are increased based on age, obesity, smoking and chronic lung disease.  Operative note: With the patient under adequate general endotracheal anesthesia the abdomen was prepped with ChloraPrep and draped. She received Kefzol prior the procedure. SCD stockings restart her DVT prevention. The mass was below into the right of the midline. An infraumbilical incision extending to the base of the umbilicus was made in the midline. This was carried down to the skin and generous layer of adipose tissue. Hemostasis was with electrocautery. The hernia sac was excised at the fascial level with cautery. The large mass of omentum could not be reduced through the fairly small 1 x 4 cm transversely orientated defect. This was amputated and hemostasis achieved with 3-0 Vicryl ties. The undersurface of the fascia was cleared circumferentially. A 6.4 cm mesh was then placed and 6 trans-fascial fixation sutures of 0 Surgilon were placed at the 12, 2, 4, 6, 8 and 10:00 position. These were all placed under direct vision and then tied sequentially. The fascial defect was easily approximated without tension. Each 0 Surgilon suture and grasped the mesh with the transverse closure. The adipose tissue was then approximated with a running 2-0 Vicryl suture. Superficial layer of adipose tissue approximated  with a running 3-0 Vicryl suture. Skin closed with a running 4-0 Vicryl subcuticular suture. Benzoin, Steri-Strips, Telfa and Tegaderm dressing applied.  Patient tolerated the procedure well and was taken to recovery in stable condition.

## 2015-06-02 NOTE — Anesthesia Preprocedure Evaluation (Addendum)
Anesthesia Evaluation  Patient identified by MRN, date of birth, ID band Patient awake    Reviewed: Allergy & Precautions, NPO status , Patient's Chart, lab work & pertinent test results, reviewed documented beta blocker date and time   History of Anesthesia Complications (+) PONV  Airway Mallampati: III  TM Distance: >3 FB     Dental  (+) Chipped, Caps, Dental Advisory Given   Pulmonary asthma , sleep apnea , COPD, Current Smoker,           Cardiovascular hypertension, Pt. on medications and Pt. on home beta blockers      Neuro/Psych PSYCHIATRIC DISORDERS Anxiety Depression    GI/Hepatic GERD  Controlled,  Endo/Other    Renal/GU Renal InsufficiencyRenal disease     Musculoskeletal  (+) Arthritis ,   Abdominal   Peds  Hematology   Anesthesia Other Findings Obese. Smokes. MVP. Increased diastolics. No cardiac symptoms.  Reproductive/Obstetrics                          Anesthesia Physical Anesthesia Plan  ASA: III  Anesthesia Plan: General   Post-op Pain Management:    Induction: Intravenous  Airway Management Planned: Oral ETT  Additional Equipment:   Intra-op Plan:   Post-operative Plan:   Informed Consent: I have reviewed the patients History and Physical, chart, labs and discussed the procedure including the risks, benefits and alternatives for the proposed anesthesia with the patient or authorized representative who has indicated his/her understanding and acceptance.     Plan Discussed with: CRNA  Anesthesia Plan Comments:        Anesthesia Quick Evaluation

## 2015-06-09 ENCOUNTER — Encounter: Payer: Self-pay | Admitting: General Surgery

## 2015-06-09 ENCOUNTER — Ambulatory Visit (INDEPENDENT_AMBULATORY_CARE_PROVIDER_SITE_OTHER): Payer: Medicare Other | Admitting: General Surgery

## 2015-06-09 VITALS — BP 122/80 | HR 82 | Resp 12 | Ht 62.0 in | Wt 214.0 lb

## 2015-06-09 DIAGNOSIS — K439 Ventral hernia without obstruction or gangrene: Secondary | ICD-10-CM

## 2015-06-09 NOTE — Patient Instructions (Addendum)
Patient to return as needed Resume activities as tolerated.

## 2015-06-09 NOTE — Progress Notes (Signed)
Patient ID: Angel Moss, female   DOB: 1961-02-25, 55 y.o.   MRN: [id number]  Chief Complaint  Patient presents with  . Routine Post Op    ventral hernia    HPI TYLEA Moss is a 55 y.o. female here today for her post op ventral hernia repair done 06/02/15. Patient states she is doing well. No difficulty with bowel or bladder function. Good relief of soreness with anti-inflammatories.  I personally reviewed the patient's history. HPI  Past Medical History  Diagnosis Date  . Obesity   . Anxiety   . Mitral valve prolapse   . History of kidney stones   . Hyperlipidemia   . Hypertension   . COPD (chronic obstructive pulmonary disease) ([place])   . Depression   . Ovarian cyst   . Hirsutism   . Cervical cancer ([place]) 1989  . Dysrhythmia     FLUTTERS WHEN ANXIOUS  . Sleep apnea     NO CPAP  . Asthma   . Chronic kidney disease     H/O KIDNEY STONES  . GERD (gastroesophageal reflux disease)   . Complication of anesthesia   . PONV (postoperative nausea and vomiting)     Past Surgical History  Procedure Laterality Date  . Kidney stone surgery Left 11/23/2011      1.3 cm left superior pole stone removed via nephrolithotomy by [name], M.D.  . Colonoscopy      precancerous polyp  . Cervical cone biopsy    . Tonsillectomy and adenoidectomy    . Vaginal hysterectomy  1989    partial  . Colonoscopy  2015  . Cholecystectomy  04/14/2012    Completed by [name], M.D.: Chronic cholecystitis and cholelithiasis  . Ventral hernia repair N/A 06/02/2015    Procedure: HERNIA REPAIR VENTRAL ADULT;  Surgeon: [name], MD;  Location: [facility] ORS;  Service: General;  Laterality: N/A;  . Hernia repair  06/02/2015    6.4 cm Ventralite ST mesh at ventral hernia at umbilicus.     Family History  Problem Relation Age of Onset  . Hypertension Mother   . Heart disease Father   . Cancer Paternal Grandmother     breast  . Breast cancer Paternal Grandmother   . Diabetes Neg Hx      Social History Social History  Substance Use Topics  . Smoking status: Current Every Day Smoker -- 0.25 packs/day for 10 years    Types: Cigarettes  . Smokeless tobacco: Never Used  . Alcohol Use: No    Allergies  Allergen Reactions  . Hydrocodone Nausea And Vomiting and Nausea Only  . Oxycodone Hives and Nausea And Vomiting    Current Outpatient Prescriptions  Medication Sig Dispense Refill  . albuterol (PROVENTIL HFA;VENTOLIN HFA) 108 (90 Base) MCG/ACT inhaler Inhale into the lungs every 6 (six) hours as needed for wheezing or shortness of breath.    . ALPRAZolam (XANAX) 0.5 MG tablet Take 0.5 mg by mouth 2 (two) times daily.     Marland Kitchen aspirin EC 81 MG tablet Take by mouth.    . Biotin 1000 MCG tablet Take 1,000 mcg by mouth 2 (two) times daily.    Marland Kitchen esomeprazole (NEXIUM) 40 MG capsule TAKE ONE CAPSULE BY MOUTH TWICE DAILY 180 capsule 0  . fluticasone (FLOVENT HFA) 110 MCG/ACT inhaler Inhale 2 puffs into the lungs as needed.     . hydrochlorothiazide (HYDRODIURIL) 25 MG tablet Take by mouth.    Marland Kitchen HYDROcodone-acetaminophen (NORCO) 5-325 MG tablet  Take 1-2 tablets by mouth every 4 (four) hours as needed for moderate pain. 30 tablet 0  . metoprolol succinate (TOPROL-XL) 50 MG 24 hr tablet TAKE ONE TABLET BY MOUTH ONCE DAILY FOLLOWING A MEAL (Patient taking differently: Take 50 mg by mouth every morning. TAKE ONE TABLET BY MOUTH ONCE DAILY FOLLOWING A MEAL) 90 tablet 1  . nystatin (MYCOSTATIN) powder     . ondansetron (ZOFRAN ODT) 4 MG disintegrating tablet Take 1 tablet (4 mg total) by mouth every 4 (four) hours as needed for nausea or vomiting (Take with hydrocodone if needed for nausea control.). 20 tablet 0  . rosuvastatin (CRESTOR) 20 MG tablet Take 1 tablet (20 mg total) by mouth daily. (Patient taking differently: Take 20 mg by mouth at bedtime. ) 30 tablet 5  . sertraline (ZOLOFT) 100 MG tablet Take 100 mg by mouth 2 (two) times daily.      No current facility-administered  medications for this visit.    Review of Systems Review of Systems  Constitutional: Negative.   Respiratory: Negative.   Cardiovascular: Negative.     Blood pressure 122/80, pulse 82, resp. rate 12, height 5\' 2"  (1.575 m), weight 214 lb (97.07 kg).  Physical Exam Physical Exam  Constitutional: She appears well-developed and well-nourished.  Abdominal: Soft. Normal appearance and bowel sounds are normal. There is no hepatomegaly. There is no tenderness. No hernia.  Ventral hernia repair intact.  Neurological: She is alert.      Assessment    Doing well status post ventral hernia repair.    Plan    The patient will increase her activity as tolerated. Proper lifting technique demonstrated.   Patient to return as needed. PCP:  [name] This information has been scribed by Gaspar Cola CMA.     [name] 06/09/2015, 9:15 PM

## 2015-08-06 ENCOUNTER — Encounter: Payer: Self-pay | Admitting: General Surgery

## 2015-10-31 ENCOUNTER — Other Ambulatory Visit: Payer: Self-pay | Admitting: Internal Medicine

## 2015-11-25 ENCOUNTER — Other Ambulatory Visit: Payer: Self-pay | Admitting: Internal Medicine

## 2015-11-25 DIAGNOSIS — Z1231 Encounter for screening mammogram for malignant neoplasm of breast: Secondary | ICD-10-CM

## 2015-11-27 ENCOUNTER — Ambulatory Visit
Admission: RE | Admit: 2015-11-27 | Discharge: 2015-11-27 | Disposition: A | Discharge status: Home / Self Care | Payer: Medicare Other | Source: Ambulatory Visit | Attending: Internal Medicine | Admitting: Internal Medicine

## 2015-11-27 DIAGNOSIS — Z1231 Encounter for screening mammogram for malignant neoplasm of breast: Secondary | ICD-10-CM | POA: Insufficient documentation

## 2016-06-21 ENCOUNTER — Emergency Department
Admission: EM | Admit: 2016-06-21 | Discharge: 2016-06-21 | Disposition: A | Discharge status: Home / Self Care | Payer: Medicare Other | Attending: Emergency Medicine | Admitting: Emergency Medicine

## 2016-06-21 ENCOUNTER — Other Ambulatory Visit: Payer: Self-pay | Admitting: Internal Medicine

## 2016-06-21 DIAGNOSIS — Z79899 Other long term (current) drug therapy: Secondary | ICD-10-CM | POA: Insufficient documentation

## 2016-06-21 DIAGNOSIS — R110 Nausea: Secondary | ICD-10-CM

## 2016-06-21 DIAGNOSIS — J45909 Unspecified asthma, uncomplicated: Secondary | ICD-10-CM | POA: Insufficient documentation

## 2016-06-21 DIAGNOSIS — Z5321 Procedure and treatment not carried out due to patient leaving prior to being seen by health care provider: Secondary | ICD-10-CM | POA: Insufficient documentation

## 2016-06-21 DIAGNOSIS — J449 Chronic obstructive pulmonary disease, unspecified: Secondary | ICD-10-CM | POA: Diagnosis not present

## 2016-06-21 DIAGNOSIS — F17210 Nicotine dependence, cigarettes, uncomplicated: Secondary | ICD-10-CM | POA: Insufficient documentation

## 2016-06-21 DIAGNOSIS — R1084 Generalized abdominal pain: Secondary | ICD-10-CM

## 2016-06-21 DIAGNOSIS — I129 Hypertensive chronic kidney disease with stage 1 through stage 4 chronic kidney disease, or unspecified chronic kidney disease: Secondary | ICD-10-CM | POA: Diagnosis not present

## 2016-06-21 DIAGNOSIS — Z7982 Long term (current) use of aspirin: Secondary | ICD-10-CM | POA: Insufficient documentation

## 2016-06-21 DIAGNOSIS — R55 Syncope and collapse: Secondary | ICD-10-CM | POA: Diagnosis not present

## 2016-06-21 DIAGNOSIS — R531 Weakness: Secondary | ICD-10-CM | POA: Diagnosis present

## 2016-06-21 DIAGNOSIS — N189 Chronic kidney disease, unspecified: Secondary | ICD-10-CM | POA: Insufficient documentation

## 2016-06-21 NOTE — ED Triage Notes (Signed)
Pt started yesterday feeling weak and then had an episode of "almost passing out" and not feeling like herself followed by perfuse sweating and N/V - pt states she has not felt dizzy or like she was going to pass out today but continues with nausea

## 2016-06-21 NOTE — ED Notes (Signed)
Pt stated after triage that she has another appt at 4pm and will not be able to stay and be seen - pt is aware that she could need medical treatment and follow up but is choosing to leave anyway

## 2016-06-22 ENCOUNTER — Telehealth: Payer: Self-pay | Admitting: Emergency Medicine

## 2016-06-22 NOTE — Telephone Encounter (Signed)
Called patient due to lwot to inquire about condition and follow up plans. Says she went to dr sparks yesterday.

## 2016-06-25 ENCOUNTER — Ambulatory Visit
Admission: RE | Admit: 2016-06-25 | Discharge: 2016-06-25 | Disposition: A | Discharge status: Home / Self Care | Payer: Medicare Other | Source: Ambulatory Visit | Attending: Internal Medicine | Admitting: Internal Medicine

## 2016-06-25 DIAGNOSIS — Z9049 Acquired absence of other specified parts of digestive tract: Secondary | ICD-10-CM | POA: Diagnosis not present

## 2016-06-25 DIAGNOSIS — I714 Abdominal aortic aneurysm, without rupture: Secondary | ICD-10-CM | POA: Insufficient documentation

## 2016-06-25 DIAGNOSIS — R110 Nausea: Secondary | ICD-10-CM

## 2016-06-25 DIAGNOSIS — N200 Calculus of kidney: Secondary | ICD-10-CM | POA: Insufficient documentation

## 2016-06-25 DIAGNOSIS — R1084 Generalized abdominal pain: Secondary | ICD-10-CM | POA: Insufficient documentation

## 2016-10-19 ENCOUNTER — Other Ambulatory Visit: Payer: Self-pay | Admitting: Physician Assistant

## 2016-10-19 DIAGNOSIS — Z1231 Encounter for screening mammogram for malignant neoplasm of breast: Secondary | ICD-10-CM

## 2016-11-29 ENCOUNTER — Ambulatory Visit
Admission: RE | Admit: 2016-11-29 | Discharge: 2016-11-29 | Disposition: A | Discharge status: Home / Self Care | Payer: Medicare Other | Source: Ambulatory Visit | Attending: Physician Assistant | Admitting: Physician Assistant

## 2016-11-29 DIAGNOSIS — Z1231 Encounter for screening mammogram for malignant neoplasm of breast: Secondary | ICD-10-CM | POA: Diagnosis present

## 2017-11-22 ENCOUNTER — Other Ambulatory Visit: Payer: Self-pay | Admitting: Internal Medicine

## 2017-11-22 DIAGNOSIS — Z1231 Encounter for screening mammogram for malignant neoplasm of breast: Secondary | ICD-10-CM

## 2017-12-09 ENCOUNTER — Ambulatory Visit
Admission: RE | Admit: 2017-12-09 | Discharge: 2017-12-09 | Disposition: A | Discharge status: Home / Self Care | Payer: Medicare Other | Source: Ambulatory Visit | Attending: Internal Medicine | Admitting: Internal Medicine

## 2017-12-09 DIAGNOSIS — Z1231 Encounter for screening mammogram for malignant neoplasm of breast: Secondary | ICD-10-CM | POA: Diagnosis not present

## 2018-01-20 ENCOUNTER — Ambulatory Visit
Admission: RE | Admit: 2018-01-20 | Discharge: 2018-01-20 | Disposition: A | Discharge status: Home / Self Care | Payer: Medicare Other | Source: Ambulatory Visit | Attending: Unknown Physician Specialty | Admitting: Unknown Physician Specialty

## 2018-01-20 ENCOUNTER — Encounter: Payer: Self-pay | Admitting: *Deleted

## 2018-01-20 ENCOUNTER — Other Ambulatory Visit: Payer: Self-pay

## 2018-01-20 ENCOUNTER — Ambulatory Visit: Payer: Medicare Other | Admitting: Anesthesiology

## 2018-01-20 ENCOUNTER — Encounter
Admission: RE | Disposition: A | Discharge status: Home / Self Care | Payer: Self-pay | Source: Ambulatory Visit | Attending: Unknown Physician Specialty

## 2018-01-20 DIAGNOSIS — Z6833 Body mass index (BMI) 33.0-33.9, adult: Secondary | ICD-10-CM | POA: Diagnosis not present

## 2018-01-20 DIAGNOSIS — Z7982 Long term (current) use of aspirin: Secondary | ICD-10-CM | POA: Diagnosis not present

## 2018-01-20 DIAGNOSIS — I10 Essential (primary) hypertension: Secondary | ICD-10-CM | POA: Diagnosis not present

## 2018-01-20 DIAGNOSIS — E785 Hyperlipidemia, unspecified: Secondary | ICD-10-CM | POA: Insufficient documentation

## 2018-01-20 DIAGNOSIS — K635 Polyp of colon: Secondary | ICD-10-CM | POA: Insufficient documentation

## 2018-01-20 DIAGNOSIS — F410 Panic disorder [episodic paroxysmal anxiety] without agoraphobia: Secondary | ICD-10-CM | POA: Insufficient documentation

## 2018-01-20 DIAGNOSIS — F329 Major depressive disorder, single episode, unspecified: Secondary | ICD-10-CM | POA: Insufficient documentation

## 2018-01-20 DIAGNOSIS — Z79891 Long term (current) use of opiate analgesic: Secondary | ICD-10-CM | POA: Insufficient documentation

## 2018-01-20 DIAGNOSIS — J449 Chronic obstructive pulmonary disease, unspecified: Secondary | ICD-10-CM | POA: Insufficient documentation

## 2018-01-20 DIAGNOSIS — Z79899 Other long term (current) drug therapy: Secondary | ICD-10-CM | POA: Diagnosis not present

## 2018-01-20 DIAGNOSIS — E669 Obesity, unspecified: Secondary | ICD-10-CM | POA: Insufficient documentation

## 2018-01-20 DIAGNOSIS — Z8541 Personal history of malignant neoplasm of cervix uteri: Secondary | ICD-10-CM | POA: Diagnosis not present

## 2018-01-20 DIAGNOSIS — Z7951 Long term (current) use of inhaled steroids: Secondary | ICD-10-CM | POA: Insufficient documentation

## 2018-01-20 DIAGNOSIS — K219 Gastro-esophageal reflux disease without esophagitis: Secondary | ICD-10-CM | POA: Diagnosis not present

## 2018-01-20 DIAGNOSIS — G4730 Sleep apnea, unspecified: Secondary | ICD-10-CM | POA: Diagnosis not present

## 2018-01-20 DIAGNOSIS — Z86010 Personal history of colonic polyps: Secondary | ICD-10-CM | POA: Insufficient documentation

## 2018-01-20 DIAGNOSIS — D125 Benign neoplasm of sigmoid colon: Secondary | ICD-10-CM | POA: Insufficient documentation

## 2018-01-20 DIAGNOSIS — Z87891 Personal history of nicotine dependence: Secondary | ICD-10-CM | POA: Diagnosis not present

## 2018-01-20 DIAGNOSIS — Z09 Encounter for follow-up examination after completed treatment for conditions other than malignant neoplasm: Secondary | ICD-10-CM | POA: Diagnosis present

## 2018-01-20 HISTORY — DX: Unspecified asthma, uncomplicated: J45.909

## 2018-01-20 HISTORY — PX: COLONOSCOPY WITH PROPOFOL: SHX5780

## 2018-01-20 SURGERY — COLONOSCOPY WITH PROPOFOL
Anesthesia: General

## 2018-01-20 MED ORDER — MIDAZOLAM HCL 2 MG/2ML IJ SOLN
INTRAMUSCULAR | Status: AC
  Filled 2018-01-20: qty 2

## 2018-01-20 MED ORDER — FENTANYL CITRATE (PF) 100 MCG/2ML IJ SOLN
INTRAMUSCULAR | Status: AC
  Filled 2018-01-20: qty 2

## 2018-01-20 MED ORDER — SODIUM CHLORIDE 0.9 % IV SOLN
INTRAVENOUS | Status: DC
  Administered 2018-01-20: 13:00:00 via INTRAVENOUS

## 2018-01-20 MED ORDER — PROPOFOL 10 MG/ML IV BOLUS
INTRAVENOUS | Status: DC | PRN
  Administered 2018-01-20: 100 mg via INTRAVENOUS
  Administered 2018-01-20 (×3): 30 mg via INTRAVENOUS

## 2018-01-20 MED ORDER — LIDOCAINE HCL (PF) 2 % IJ SOLN
INTRAMUSCULAR | Status: AC
  Filled 2018-01-20: qty 10

## 2018-01-20 MED ORDER — SODIUM CHLORIDE 0.9 % IV SOLN: INTRAVENOUS | Status: DC

## 2018-01-20 MED ORDER — PROPOFOL 500 MG/50ML IV EMUL
INTRAVENOUS | Status: DC | PRN
  Administered 2018-01-20: 140 ug/kg/min via INTRAVENOUS

## 2018-01-20 NOTE — Op Note (Signed)
[facility] Gastroenterology Patient Name: Angel Moss Procedure Date: 01/20/2018 11:47 AM MRN: [id number] Account #: [id number] Date of Birth: [birth year] Admit Type: Outpatient Age: 57 Room: [facility] ENDO ROOM 1 Gender: Female Note Status: Finalized Procedure:            Colonoscopy Indications:          High risk colon cancer surveillance: Personal history                        of colonic polyps Providers:            Manya Silvas, MD Referring MD:         Leonie Douglas. [name], MD (Referring MD) Medicines:            Propofol per Anesthesia Complications:        No immediate complications. Procedure:            Pre-Anesthesia Assessment:                       - After reviewing the risks and benefits, the patient                        was deemed in satisfactory condition to undergo the                        procedure.                       After obtaining informed consent, the colonoscope was                        passed under direct vision. Throughout the procedure,                        the patient's blood pressure, pulse, and oxygen                        saturations were monitored continuously. The                        Colonoscope was introduced through the anus and                        advanced to the the cecum, identified by appendiceal                        orifice and ileocecal valve. The colonoscopy was                        performed without difficulty. The patient tolerated the                        procedure well. The quality of the bowel preparation                        was good. Findings:      A diminutive polyp was found in the ascending colon. The polyp was       sessile. The polyp was removed with a jumbo cold forceps. Resection and       retrieval were complete.      A diminutive polyp was  found in the transverse colon. The polyp was       sessile. The polyp was removed with a jumbo cold forceps. Resection and       retrieval were  complete.      A diminutive polyp was found in the sigmoid colon. The polyp was       sessile. The polyp was removed with a jumbo cold forceps. Resection and       retrieval were complete.      Two sessile polyps were found in the recto-sigmoid colon. The polyps       were diminutive in size. These polyps were removed with a jumbo cold       forceps. Resection and retrieval were complete.      The exam was otherwise without abnormality. Impression:           - One diminutive polyp in the ascending colon, removed                        with a jumbo cold forceps. Resected and retrieved.                       - One diminutive polyp in the transverse colon, removed                        with a jumbo cold forceps. Resected and retrieved.                       - One diminutive polyp in the sigmoid colon, removed                        with a jumbo cold forceps. Resected and retrieved.                       - Two diminutive polyps at the recto-sigmoid colon,                        removed with a jumbo cold forceps. Resected and                        retrieved.                       - The examination was otherwise normal. Recommendation:       - Await pathology results. Manya Silvas, MD 01/20/2018 1:21:25 PM This report has been signed electronically. Number of Addenda: 0 Note Initiated On: 01/20/2018 11:47 AM Scope Withdrawal Time: 0 hours 14 minutes 5 seconds  Total Procedure Duration: 0 hours 21 minutes 9 seconds       [facility]

## 2018-01-20 NOTE — H&P (Signed)
Primary Care Physician:  Idelle Crouch, MD Primary Gastroenterologist:  Dr. Vira Agar  Pre-Procedure History & Physical: HPI:  Angel Moss is a 57 y.o. female is here for an colonoscopy.   Past Medical History:  Diagnosis Date  . Anxiety   . Asthma   . Cervical cancer ([place]) 1989  . Chronic kidney disease    H/O KIDNEY STONES  . Complication of anesthesia   . COPD (chronic obstructive pulmonary disease) ([place])   . Depression    with panic attacks  . Dysrhythmia    FLUTTERS WHEN ANXIOUS  . GERD (gastroesophageal reflux disease)   . Hirsutism   . History of kidney stones   . Hyperlipidemia   . Hypertension   . Mitral valve prolapse   . Obesity   . Ovarian cyst   . PONV (postoperative nausea and vomiting)   . RAD (reactive airway disease)   . Sleep apnea    NO CPAP    Past Surgical History:  Procedure Laterality Date  . CARPAL TUNNEL RELEASE  2011  . CERVICAL CONE BIOPSY    . CHOLECYSTECTOMY  04/14/2012   Completed by [name], M.D.: Chronic cholecystitis and cholelithiasis  . COLONOSCOPY     precancerous polyp  . COLONOSCOPY  2015  . eyebrow lift  12/2011  . HERNIA REPAIR  06/02/2015   6.4 cm Ventralite ST mesh at ventral hernia at umbilicus.   Marland Kitchen KIDNEY STONE SURGERY Left 11/23/2011     1.3 cm left superior pole stone removed via nephrolithotomy by [name], M.D.  . TONSILLECTOMY AND ADENOIDECTOMY    . VAGINAL HYSTERECTOMY  1989   partial  . VENTRAL HERNIA REPAIR N/A 06/02/2015   Procedure: HERNIA REPAIR VENTRAL ADULT;  Surgeon:  Bellow, MD;  Location: [facility] ORS;  Service: General;  Laterality: N/A;    Prior to Admission medications   Medication Sig Start Date End Date Taking? Authorizing Provider  albuterol (PROVENTIL HFA;VENTOLIN HFA) 108 (90 Base) MCG/ACT inhaler Inhale into the lungs every 6 (six) hours as needed for wheezing or shortness of breath.   Yes [provider]  ALPRAZolam Duanne Moron) 0.5 MG tablet Take 0.5 mg by mouth 2  (two) times daily.    Yes [provider]  aspirin EC 81 MG tablet Take by mouth.   Yes [provider]  esomeprazole (NEXIUM) 40 MG capsule TAKE ONE CAPSULE BY MOUTH TWICE DAILY 04/23/14  Yes Jackolyn Confer, MD  fluticasone (FLOVENT HFA) 110 MCG/ACT inhaler Inhale 2 puffs into the lungs as needed.    Yes [provider]  hydrochlorothiazide (HYDRODIURIL) 25 MG tablet Take by mouth. 10/09/14  Yes [provider]  metoprolol succinate (TOPROL-XL) 50 MG 24 hr tablet TAKE ONE TABLET BY MOUTH ONCE DAILY FOLLOWING A MEAL Patient taking differently: Take 50 mg by mouth 2 (two) times daily. TAKE 1.5 TABLETS BY MOUTH TWICE A DAY FOLLOWING A MEAL 03/18/14  Yes Jackolyn Confer, MD  nystatin (MYCOSTATIN) powder  03/10/15  Yes [provider]  rosuvastatin (CRESTOR) 20 MG tablet Take 1 tablet (20 mg total) by mouth daily. Patient taking differently: Take 20 mg by mouth at bedtime.  09/10/13  Yes Jackolyn Confer, MD  sertraline (ZOLOFT) 100 MG tablet Take 100 mg by mouth 2 (two) times daily.  11/20/10  Yes [provider]  Biotin 1000 MCG tablet Take 1,000 mcg by mouth 2 (two) times daily.    [provider]  famotidine (PEPCID) 20 MG tablet  Take 20 mg by mouth daily as needed for heartburn or indigestion.    [provider]  furosemide (LASIX) 20 MG tablet Take 20 mg by mouth daily.    [provider]  HYDROcodone-acetaminophen (NORCO) 5-325 MG tablet Take 1-2 tablets by mouth every 4 (four) hours as needed for moderate pain. 06/02/15    Bellow, MD  ondansetron (ZOFRAN ODT) 4 MG disintegrating tablet Take 1 tablet (4 mg total) by mouth every 4 (four) hours as needed for nausea or vomiting (Take with hydrocodone if needed for nausea control.). Patient not taking: Reported on 01/20/2018 06/02/15    Bellow, MD    Allergies as of 01/13/2018 - Review Complete 06/21/2016  Allergen Reaction Noted  . Hydrocodone  Nausea And Vomiting and Nausea Only 04/03/2013  . Oxycodone Hives and Nausea And Vomiting 05/26/2015    Family History  Problem Relation Age of Onset  . Hypertension Mother   . Heart disease Father   . Cancer Paternal Grandmother        breast  . Breast cancer Paternal Grandmother   . Diabetes Neg Hx     Social History   Socioeconomic History  . Marital status: Married    Spouse name: Not on file  . Number of children: Not on file  . Years of education: Not on file  . Highest education level: Not on file  Occupational History  . Not on file  Social Needs  . Financial resource strain: Not on file  . Food insecurity:    Worry: Not on file    Inability: Not on file  . Transportation needs:    Medical: Not on file    Non-medical: Not on file  Tobacco Use  . Smoking status: Former Smoker    Packs/day: 0.25    Years: 10.00    Pack years: 2.50    Types: Cigarettes    Last attempt to quit: 01/13/2018    Years since quitting: 0.0  . Smokeless tobacco: Never Used  Substance and Sexual Activity  . Alcohol use: No  . Drug use: No  . Sexual activity: Yes    Birth control/protection: Post-menopausal  Lifestyle  . Physical activity:    Days per week: Not on file    Minutes per session: Not on file  . Stress: Not on file  Relationships  . Social connections:    Talks on phone: Not on file    Gets together: Not on file    Attends religious service: Not on file    Active member of club or organization: Not on file    Attends meetings of clubs or organizations: Not on file    Relationship status: Not on file  . Intimate partner violence:    Fear of current or ex partner: Not on file    Emotionally abused: Not on file    Physically abused: Not on file    Forced sexual activity: Not on file  Other Topics Concern  . Not on file  Social History Narrative  . Not on file    Review of Systems: See HPI, otherwise negative ROS  Physical Exam: BP (!) 147/92   Pulse 65    Temp (!) 96.2 F (35.7 C) (Tympanic)   Ht 5\' 6"  (1.676 m)   Wt 95.3 kg   SpO2 99%   BMI 33.91 kg/m  General:   Alert,  pleasant and cooperative in NAD Head:  Normocephalic and atraumatic. Neck:  Supple; no masses or thyromegaly.  Lungs:  Clear throughout to auscultation.    Heart:  Regular rate and rhythm. Abdomen:  Soft, nontender and nondistended. Normal bowel sounds, without guarding, and without rebound.   Neurologic:  Alert and  oriented x4;  grossly normal neurologically.  Impression/Plan: Angel Moss is here for an colonoscopy to be performed for [facility] colon polyps.  Last scope was 01/26/13.  Risks, benefits, limitations, and alternatives regarding  colonoscopy have been reviewed with the patient.  Questions have been answered.  All parties agreeable.   Gaylyn Cheers, MD  01/20/2018, 12:50 PM

## 2018-01-20 NOTE — Anesthesia Preprocedure Evaluation (Signed)
Anesthesia Evaluation  Patient identified by MRN, date of birth, ID band Patient awake    Reviewed: Allergy & Precautions, H&P , NPO status , Patient's Chart, lab work & pertinent test results  History of Anesthesia Complications (+) PONV and history of anesthetic complications  Airway Mallampati: III  TM Distance: <3 FB Neck ROM: limited    Dental  (+) Chipped, Poor Dentition   Pulmonary neg shortness of breath, asthma , sleep apnea , COPD, former smoker,           Cardiovascular Exercise Tolerance: Good hypertension, (-) Past MI + dysrhythmias + Valvular Problems/Murmurs MVP      Neuro/Psych PSYCHIATRIC DISORDERS negative neurological ROS     GI/Hepatic Neg liver ROS, GERD  Medicated and Controlled,  Endo/Other  negative endocrine ROS  Renal/GU Renal disease  negative genitourinary   Musculoskeletal  (+) Arthritis ,   Abdominal   Peds  Hematology negative hematology ROS (+)   Anesthesia Other Findings Past Medical History: No date: Anxiety No date: Asthma 1989: Cervical cancer ([place]) No date: Chronic kidney disease     Comment:  H/O KIDNEY STONES No date: Complication of anesthesia No date: COPD (chronic obstructive pulmonary disease) ([facility]) No date: Depression     Comment:  with panic attacks No date: Dysrhythmia     Comment:  FLUTTERS WHEN ANXIOUS No date: GERD (gastroesophageal reflux disease) No date: Hirsutism No date: History of kidney stones No date: Hyperlipidemia No date: Hypertension No date: Mitral valve prolapse No date: Obesity No date: Ovarian cyst No date: PONV (postoperative nausea and vomiting) No date: RAD (reactive airway disease) No date: Sleep apnea     Comment:  NO CPAP  Past Surgical History: 2011: CARPAL TUNNEL RELEASE No date: CERVICAL CONE BIOPSY 04/14/2012: CHOLECYSTECTOMY     Comment:  Completed by [name], M.D.: Chronic cholecystitis               and  cholelithiasis No date: COLONOSCOPY     Comment:  precancerous polyp 2015: COLONOSCOPY 12/2011: eyebrow lift 06/02/2015: HERNIA REPAIR     Comment:  6.4 cm Ventralite ST mesh at ventral hernia at               umbilicus.  11/23/2011  : KIDNEY STONE SURGERY; Left     Comment:  1.3 cm left superior pole stone removed via               nephrolithotomy by [name], M.D. No date: TONSILLECTOMY AND ADENOIDECTOMY 1989: VAGINAL HYSTERECTOMY     Comment:  partial 06/02/2015: VENTRAL HERNIA REPAIR; N/A     Comment:  Procedure: HERNIA REPAIR VENTRAL ADULT;  Surgeon:               [name], MD;  Location: [facility] ORS;  Service:               General;  Laterality: N/A;  BMI    Body Mass Index:  33.91 kg/m      Reproductive/Obstetrics negative OB ROS                             Anesthesia Physical Anesthesia Plan  ASA: III  Anesthesia Plan: General   Post-op Pain Management:    Induction: Intravenous  PONV Risk Score and Plan: Propofol infusion and TIVA  Airway Management Planned: Natural Airway and Nasal Cannula  Additional Equipment:   Intra-op Plan:   Post-operative Plan:  Informed Consent: I have reviewed the patients History and Physical, chart, labs and discussed the procedure including the risks, benefits and alternatives for the proposed anesthesia with the patient or authorized representative who has indicated his/her understanding and acceptance.   Dental Advisory Given  Plan Discussed with: Anesthesiologist, CRNA and Surgeon  Anesthesia Plan Comments: (Patient consented for risks of anesthesia including but not limited to:  - adverse reactions to medications - risk of intubation if required - damage to teeth, lips or other oral mucosa - sore throat or hoarseness - Damage to heart, brain, lungs or loss of life  Patient voiced understanding.)        Anesthesia Quick Evaluation

## 2018-01-20 NOTE — Anesthesia Postprocedure Evaluation (Signed)
Anesthesia Post Note  Patient: Angel Moss  Procedure(s) Performed: COLONOSCOPY WITH PROPOFOL (N/A )  Patient location during evaluation: Endoscopy Anesthesia Type: General Level of consciousness: awake and alert Pain management: pain level controlled Vital Signs Assessment: post-procedure vital signs reviewed and stable Respiratory status: spontaneous breathing, nonlabored ventilation, respiratory function stable and patient connected to nasal cannula oxygen Cardiovascular status: blood pressure returned to baseline and stable Postop Assessment: no apparent nausea or vomiting Anesthetic complications: no     Last Vitals:  Vitals:   01/20/18 1320 01/20/18 1330  BP: (!) 112/47 (!) 112/51  Pulse: 67 65  Resp: 18 (!) 27  Temp: (!) 36.1 C   SpO2: 98% 99%    Last Pain:  Vitals:   01/20/18 1330  TempSrc:   PainSc: 0-No pain                 Precious Haws Piscitello

## 2018-01-20 NOTE — Anesthesia Post-op Follow-up Note (Signed)
Anesthesia QCDR form completed.        

## 2018-01-20 NOTE — Transfer of Care (Signed)
Immediate Anesthesia Transfer of Care Note  Patient: Angel Moss  Procedure(s) Performed: COLONOSCOPY WITH PROPOFOL (N/A )  Patient Location: Endoscopy Unit  Anesthesia Type:General  Level of Consciousness: awake  Airway & Oxygen Therapy: Patient Spontanous Breathing and Patient connected to nasal cannula oxygen  Post-op Assessment: Report given to RN and Post -op Vital signs reviewed and stable  Post vital signs: Reviewed and stable  Last Vitals:  Vitals Value Taken Time  BP    Temp    Pulse 65 01/20/2018  1:20 PM  Resp 22 01/20/2018  1:20 PM  SpO2 98 % 01/20/2018  1:20 PM  Vitals shown include unvalidated device data.  Last Pain:  Vitals:   01/20/18 1233  TempSrc: Tympanic  PainSc: 0-No pain         Complications: No apparent anesthesia complications

## 2018-01-23 ENCOUNTER — Encounter: Payer: Self-pay | Admitting: Unknown Physician Specialty

## 2018-01-25 LAB — SURGICAL PATHOLOGY

## 2018-04-07 ENCOUNTER — Encounter (HOSPITAL_BASED_OUTPATIENT_CLINIC_OR_DEPARTMENT_OTHER): Payer: Self-pay | Admitting: *Deleted

## 2018-04-07 ENCOUNTER — Other Ambulatory Visit: Payer: Self-pay

## 2018-04-07 NOTE — Progress Notes (Signed)
   04/07/18 1508  OBSTRUCTIVE SLEEP APNEA  Have you ever been diagnosed with sleep apnea through a sleep study? Yes  If yes, do you have and use a CPAP or BPAP machine every night? 0 (states she couldn't sleep with it on)  Do you snore loudly (loud enough to be heard through closed doors)?  1  Do you often feel tired, fatigued, or sleepy during the daytime (such as falling asleep during driving or talking to someone)? 0  Has anyone observed you stop breathing during your sleep? 1  Do you have, or are you being treated for high blood pressure? 1  BMI more than 35 kg/m2? 1  Age > 50 (1-yes) 1  Neck circumference greater than:Female 16 inches or larger, Female 17inches or larger? 0  Female Gender (Yes=1) 0  Obstructive Sleep Apnea Score 5

## 2018-04-07 NOTE — Progress Notes (Signed)
Patient's chart, ECHO, Sleep Study and Cards notes reviewed with Dr Gifford Shave. Pt has severe OSA with AHI 32.4, this was diagnosed in 08-2017. States she tried CPAP briefly and couldn't sleep with it and returned to company. She states 02 was also recommended and she does not use either. Pt will come in for BMP, EKG and will check 02 sat and have anesthesia consult. Dr Gifford Shave states OK for [facility] pending evaluation.

## 2018-04-12 ENCOUNTER — Encounter (HOSPITAL_BASED_OUTPATIENT_CLINIC_OR_DEPARTMENT_OTHER)
Admission: RE | Admit: 2018-04-12 | Discharge: 2018-04-12 | Disposition: A | Discharge status: Home / Self Care | Payer: Medicare Other | Source: Ambulatory Visit | Attending: Specialist | Admitting: Specialist

## 2018-04-12 DIAGNOSIS — Z01818 Encounter for other preprocedural examination: Secondary | ICD-10-CM | POA: Diagnosis present

## 2018-04-12 LAB — BASIC METABOLIC PANEL
Anion gap: 11 (ref 5–15)
BUN: 11 mg/dL (ref 6–20)
CO2: 26 mmol/L (ref 22–32)
Calcium: 9.5 mg/dL (ref 8.9–10.3)
Chloride: 102 mmol/L (ref 98–111)
Creatinine, Ser: 0.73 mg/dL (ref 0.44–1.00)
GFR calc Af Amer: >60 mL/min (ref >60)
Glucose, Bld: 136 mg/dL — ABNORMAL HIGH (ref 70–99)
Potassium: 4.1 mmol/L (ref 3.5–5.1)
SODIUM: 139 mmol/L (ref 135–145)

## 2018-04-12 NOTE — Progress Notes (Signed)
Anesthesia consult per Dr. Marcell Barlow, will proceed with surgery as scheduled. Updated weight, height, and O2 sat. In epic.

## 2018-04-17 ENCOUNTER — Ambulatory Visit (HOSPITAL_BASED_OUTPATIENT_CLINIC_OR_DEPARTMENT_OTHER)
Admission: RE | Admit: 2018-04-17 | Discharge: 2018-04-18 | Disposition: A | Discharge status: Home / Self Care | Payer: Medicare Other | Attending: Specialist | Admitting: Specialist

## 2018-04-17 ENCOUNTER — Encounter (HOSPITAL_BASED_OUTPATIENT_CLINIC_OR_DEPARTMENT_OTHER)
Admission: RE | Disposition: A | Discharge status: Home / Self Care | Payer: Self-pay | Source: Home / Self Care | Attending: Specialist

## 2018-04-17 ENCOUNTER — Other Ambulatory Visit: Payer: Self-pay

## 2018-04-17 ENCOUNTER — Ambulatory Visit (HOSPITAL_BASED_OUTPATIENT_CLINIC_OR_DEPARTMENT_OTHER): Payer: Medicare Other | Admitting: Anesthesiology

## 2018-04-17 DIAGNOSIS — F410 Panic disorder [episodic paroxysmal anxiety] without agoraphobia: Secondary | ICD-10-CM | POA: Insufficient documentation

## 2018-04-17 DIAGNOSIS — Z79899 Other long term (current) drug therapy: Secondary | ICD-10-CM | POA: Insufficient documentation

## 2018-04-17 DIAGNOSIS — M793 Panniculitis, unspecified: Secondary | ICD-10-CM | POA: Insufficient documentation

## 2018-04-17 DIAGNOSIS — R0600 Dyspnea, unspecified: Secondary | ICD-10-CM | POA: Diagnosis not present

## 2018-04-17 DIAGNOSIS — Z7982 Long term (current) use of aspirin: Secondary | ICD-10-CM | POA: Diagnosis not present

## 2018-04-17 DIAGNOSIS — E669 Obesity, unspecified: Secondary | ICD-10-CM | POA: Insufficient documentation

## 2018-04-17 DIAGNOSIS — E785 Hyperlipidemia, unspecified: Secondary | ICD-10-CM | POA: Diagnosis not present

## 2018-04-17 DIAGNOSIS — L680 Hirsutism: Secondary | ICD-10-CM | POA: Insufficient documentation

## 2018-04-17 DIAGNOSIS — Z9049 Acquired absence of other specified parts of digestive tract: Secondary | ICD-10-CM | POA: Insufficient documentation

## 2018-04-17 DIAGNOSIS — J449 Chronic obstructive pulmonary disease, unspecified: Secondary | ICD-10-CM | POA: Diagnosis not present

## 2018-04-17 DIAGNOSIS — I10 Essential (primary) hypertension: Secondary | ICD-10-CM | POA: Insufficient documentation

## 2018-04-17 DIAGNOSIS — F329 Major depressive disorder, single episode, unspecified: Secondary | ICD-10-CM | POA: Insufficient documentation

## 2018-04-17 DIAGNOSIS — Z6837 Body mass index (BMI) 37.0-37.9, adult: Secondary | ICD-10-CM | POA: Insufficient documentation

## 2018-04-17 DIAGNOSIS — K219 Gastro-esophageal reflux disease without esophagitis: Secondary | ICD-10-CM | POA: Diagnosis not present

## 2018-04-17 DIAGNOSIS — K439 Ventral hernia without obstruction or gangrene: Secondary | ICD-10-CM | POA: Insufficient documentation

## 2018-04-17 DIAGNOSIS — L304 Erythema intertrigo: Secondary | ICD-10-CM | POA: Diagnosis not present

## 2018-04-17 DIAGNOSIS — G4733 Obstructive sleep apnea (adult) (pediatric): Secondary | ICD-10-CM | POA: Insufficient documentation

## 2018-04-17 DIAGNOSIS — Z885 Allergy status to narcotic agent status: Secondary | ICD-10-CM | POA: Insufficient documentation

## 2018-04-17 DIAGNOSIS — F419 Anxiety disorder, unspecified: Secondary | ICD-10-CM | POA: Insufficient documentation

## 2018-04-17 DIAGNOSIS — E881 Lipodystrophy, not elsewhere classified: Secondary | ICD-10-CM | POA: Diagnosis not present

## 2018-04-17 DIAGNOSIS — Z87891 Personal history of nicotine dependence: Secondary | ICD-10-CM | POA: Insufficient documentation

## 2018-04-17 DIAGNOSIS — Z9889 Other specified postprocedural states: Secondary | ICD-10-CM

## 2018-04-17 DIAGNOSIS — I341 Nonrheumatic mitral (valve) prolapse: Secondary | ICD-10-CM | POA: Diagnosis not present

## 2018-04-17 DIAGNOSIS — Z8541 Personal history of malignant neoplasm of cervix uteri: Secondary | ICD-10-CM | POA: Diagnosis not present

## 2018-04-17 DIAGNOSIS — E65 Localized adiposity: Secondary | ICD-10-CM | POA: Diagnosis not present

## 2018-04-17 HISTORY — DX: Panic disorder (episodic paroxysmal anxiety): F41.0

## 2018-04-17 HISTORY — PX: ABDOMINOPLASTY: SHX5355

## 2018-04-17 HISTORY — DX: Dyspnea, unspecified: R06.00

## 2018-04-17 SURGERY — ABDOMINOPLASTY
Anesthesia: General | Site: Abdomen

## 2018-04-17 MED ORDER — SODIUM BICARBONATE 4 % IV SOLN
INTRAVENOUS | Status: DC | PRN
  Administered 2018-04-17: 5 mL via INTRAVENOUS

## 2018-04-17 MED ORDER — SERTRALINE HCL 100 MG PO TABS
100.0000 mg | ORAL_TABLET | Freq: Two times a day (BID) | ORAL | Status: DC
  Administered 2018-04-17: 100 mg via ORAL

## 2018-04-17 MED ORDER — ONDANSETRON HCL 4 MG/2ML IJ SOLN
INTRAMUSCULAR | Status: AC
  Filled 2018-04-17: qty 2

## 2018-04-17 MED ORDER — PROPOFOL 10 MG/ML IV BOLUS
INTRAVENOUS | Status: AC
  Filled 2018-04-17: qty 40

## 2018-04-17 MED ORDER — MIDAZOLAM HCL 2 MG/2ML IJ SOLN: 1.0000 mg | INTRAMUSCULAR | Status: DC | PRN

## 2018-04-17 MED ORDER — METOPROLOL SUCCINATE ER 50 MG PO TB24
50.0000 mg | ORAL_TABLET | Freq: Two times a day (BID) | ORAL | Status: DC
  Administered 2018-04-17: 50 mg via ORAL

## 2018-04-17 MED ORDER — ONDANSETRON HCL 4 MG/2ML IJ SOLN
INTRAMUSCULAR | Status: DC | PRN
  Administered 2018-04-17: 4 mg via INTRAVENOUS

## 2018-04-17 MED ORDER — FLUTICASONE PROPIONATE HFA 110 MCG/ACT IN AERO: 2.0000 | INHALATION_SPRAY | Freq: Two times a day (BID) | RESPIRATORY_TRACT | Status: DC

## 2018-04-17 MED ORDER — FENTANYL CITRATE (PF) 100 MCG/2ML IJ SOLN
INTRAMUSCULAR | Status: AC
  Filled 2018-04-17: qty 2

## 2018-04-17 MED ORDER — ROCURONIUM BROMIDE 50 MG/5ML IV SOSY
PREFILLED_SYRINGE | INTRAVENOUS | Status: AC
  Filled 2018-04-17: qty 5

## 2018-04-17 MED ORDER — HYDROCHLOROTHIAZIDE 25 MG PO TABS
25.0000 mg | ORAL_TABLET | Freq: Every day | ORAL | Status: DC
  Administered 2018-04-17: 25 mg via ORAL

## 2018-04-17 MED ORDER — LIDOCAINE-EPINEPHRINE 0.5 %-1:200000 IJ SOLN
INTRAMUSCULAR | Status: AC
  Filled 2018-04-17: qty 1

## 2018-04-17 MED ORDER — PANTOPRAZOLE SODIUM 40 MG PO TBEC
40.0000 mg | DELAYED_RELEASE_TABLET | Freq: Every day | ORAL | Status: DC
  Administered 2018-04-17: 40 mg via ORAL

## 2018-04-17 MED ORDER — DEXAMETHASONE SODIUM PHOSPHATE 10 MG/ML IJ SOLN
INTRAMUSCULAR | Status: AC
  Filled 2018-04-17: qty 1

## 2018-04-17 MED ORDER — CHLORHEXIDINE GLUCONATE CLOTH 2 % EX PADS: 6.0000 | MEDICATED_PAD | Freq: Once | CUTANEOUS | Status: DC

## 2018-04-17 MED ORDER — ALPRAZOLAM 0.5 MG PO TABS: 0.5000 mg | ORAL_TABLET | Freq: Two times a day (BID) | ORAL | Status: DC

## 2018-04-17 MED ORDER — PHENYLEPHRINE HCL 10 MG/ML IJ SOLN
INTRAMUSCULAR | Status: DC | PRN
  Administered 2018-04-17: 120 ug via INTRAVENOUS
  Administered 2018-04-17: 40 ug via INTRAVENOUS
  Administered 2018-04-17 (×2): 120 ug via INTRAVENOUS

## 2018-04-17 MED ORDER — PROPOFOL 500 MG/50ML IV EMUL
INTRAVENOUS | Status: DC | PRN
  Administered 2018-04-17: 35 ug/kg/min via INTRAVENOUS

## 2018-04-17 MED ORDER — PROPOFOL 10 MG/ML IV BOLUS
INTRAVENOUS | Status: DC | PRN
  Administered 2018-04-17: 150 mg via INTRAVENOUS

## 2018-04-17 MED ORDER — EPINEPHRINE PF 1 MG/ML IJ SOLN
INTRAMUSCULAR | Status: DC | PRN
  Administered 2018-04-17: 2 mg

## 2018-04-17 MED ORDER — FENTANYL CITRATE (PF) 100 MCG/2ML IJ SOLN
INTRAMUSCULAR | Status: AC
  Filled 2018-04-17: qty 4

## 2018-04-17 MED ORDER — EPINEPHRINE 30 MG/30ML IJ SOLN
INTRAMUSCULAR | Status: AC
  Filled 2018-04-17: qty 1

## 2018-04-17 MED ORDER — OXYCODONE-ACETAMINOPHEN 5-325 MG PO TABS
2.0000 | ORAL_TABLET | Freq: Four times a day (QID) | ORAL | Status: DC | PRN
  Administered 2018-04-17 – 2018-04-18 (×3): 2 via ORAL
  Filled 2018-04-17 (×2): qty 2

## 2018-04-17 MED ORDER — SODIUM BICARBONATE 4.2 % IV SOLN
INTRAVENOUS | Status: AC
  Filled 2018-04-17: qty 10

## 2018-04-17 MED ORDER — LACTATED RINGERS IV SOLN: 1000.0000 mL | INTRAVENOUS | 5 refills | Status: DC

## 2018-04-17 MED ORDER — CEFAZOLIN SODIUM-DEXTROSE 2-4 GM/100ML-% IV SOLN
INTRAVENOUS | Status: AC
  Filled 2018-04-17: qty 100

## 2018-04-17 MED ORDER — OXYCODONE HCL 5 MG/5ML PO SOLN: 5.0000 mg | Freq: Once | ORAL | Status: DC | PRN

## 2018-04-17 MED ORDER — ONDANSETRON HCL 4 MG/2ML IJ SOLN: 4.0000 mg | Freq: Once | INTRAMUSCULAR | Status: DC | PRN

## 2018-04-17 MED ORDER — ROCURONIUM BROMIDE 100 MG/10ML IV SOLN
INTRAVENOUS | Status: DC | PRN
  Administered 2018-04-17: 50 mg via INTRAVENOUS

## 2018-04-17 MED ORDER — FENTANYL CITRATE (PF) 100 MCG/2ML IJ SOLN
50.0000 ug | INTRAMUSCULAR | Status: AC | PRN
  Administered 2018-04-17: 25 ug via INTRAVENOUS
  Administered 2018-04-17 (×3): 50 ug via INTRAVENOUS
  Administered 2018-04-17: 100 ug via INTRAVENOUS
  Administered 2018-04-17: 25 ug via INTRAVENOUS

## 2018-04-17 MED ORDER — CEFAZOLIN SODIUM-DEXTROSE 2-4 GM/100ML-% IV SOLN: 2.0000 g | INTRAVENOUS | Status: DC

## 2018-04-17 MED ORDER — LIDOCAINE HCL (PF) 1 % IJ SOLN
INTRAMUSCULAR | Status: AC
  Filled 2018-04-17: qty 30

## 2018-04-17 MED ORDER — FENTANYL CITRATE (PF) 100 MCG/2ML IJ SOLN: 25.0000 ug | INTRAMUSCULAR | Status: DC | PRN

## 2018-04-17 MED ORDER — FUROSEMIDE 20 MG PO TABS
20.0000 mg | ORAL_TABLET | Freq: Every day | ORAL | Status: DC
  Administered 2018-04-17: 20 mg via ORAL

## 2018-04-17 MED ORDER — SUGAMMADEX SODIUM 500 MG/5ML IV SOLN
INTRAVENOUS | Status: DC | PRN
  Administered 2018-04-17: 500 mg via INTRAVENOUS

## 2018-04-17 MED ORDER — MINERAL OIL LIGHT 100 % EX OIL
TOPICAL_OIL | CUTANEOUS | Status: AC
  Filled 2018-04-17: qty 25

## 2018-04-17 MED ORDER — SCOPOLAMINE 1 MG/3DAYS TD PT72: 1.0000 | MEDICATED_PATCH | Freq: Once | TRANSDERMAL | Status: DC | PRN

## 2018-04-17 MED ORDER — SUGAMMADEX SODIUM 500 MG/5ML IV SOLN
INTRAVENOUS | Status: AC
  Filled 2018-04-17: qty 5

## 2018-04-17 MED ORDER — MIDAZOLAM HCL 2 MG/2ML IJ SOLN
INTRAMUSCULAR | Status: AC
  Filled 2018-04-17: qty 2

## 2018-04-17 MED ORDER — BACITRACIN-NEOMYCIN-POLYMYXIN 400-5-5000 EX OINT
TOPICAL_OINTMENT | CUTANEOUS | Status: AC
  Filled 2018-04-17: qty 1

## 2018-04-17 MED ORDER — OXYCODONE-ACETAMINOPHEN 5-325 MG PO TABS
ORAL_TABLET | ORAL | Status: AC
  Filled 2018-04-17: qty 2

## 2018-04-17 MED ORDER — ROSUVASTATIN CALCIUM 20 MG PO TABS: 20.0000 mg | ORAL_TABLET | Freq: Every day | ORAL | Status: DC

## 2018-04-17 MED ORDER — OXYCODONE HCL 5 MG PO TABS: 5.0000 mg | ORAL_TABLET | Freq: Once | ORAL | Status: DC | PRN

## 2018-04-17 MED ORDER — MORPHINE SULFATE (PF) 4 MG/ML IV SOLN: 2.0000 mg | INTRAVENOUS | Status: DC | PRN

## 2018-04-17 MED ORDER — LIDOCAINE 2% (20 MG/ML) 5 ML SYRINGE
INTRAMUSCULAR | Status: AC
  Filled 2018-04-17: qty 5

## 2018-04-17 MED ORDER — LIDOCAINE HCL 2 % IJ SOLN
INTRAMUSCULAR | Status: DC | PRN
  Administered 2018-04-17: 200 mL

## 2018-04-17 MED ORDER — CEFAZOLIN SODIUM-DEXTROSE 2-4 GM/100ML-% IV SOLN
2.0000 g | INTRAVENOUS | Status: AC
  Administered 2018-04-17: 2 g via INTRAVENOUS

## 2018-04-17 MED ORDER — LIDOCAINE HCL 2 % IJ SOLN
INTRAMUSCULAR | Status: AC
  Filled 2018-04-17: qty 200

## 2018-04-17 MED ORDER — DEXAMETHASONE SODIUM PHOSPHATE 4 MG/ML IJ SOLN
INTRAMUSCULAR | Status: DC | PRN
  Administered 2018-04-17: 10 mg via INTRAVENOUS

## 2018-04-17 MED ORDER — LACTATED RINGERS IV SOLN
INTRAVENOUS | Status: DC
  Administered 2018-04-17 (×4): via INTRAVENOUS

## 2018-04-17 MED ORDER — LIDOCAINE HCL (CARDIAC) PF 100 MG/5ML IV SOSY
PREFILLED_SYRINGE | INTRAVENOUS | Status: DC | PRN
  Administered 2018-04-17: 100 mg via INTRAVENOUS

## 2018-04-17 MED ORDER — ALBUTEROL SULFATE HFA 108 (90 BASE) MCG/ACT IN AERS: 2.0000 | INHALATION_SPRAY | Freq: Four times a day (QID) | RESPIRATORY_TRACT | Status: DC | PRN

## 2018-04-17 MED ORDER — EPHEDRINE SULFATE 50 MG/ML IJ SOLN
INTRAMUSCULAR | Status: DC | PRN
  Administered 2018-04-17: 10 mg via INTRAVENOUS
  Administered 2018-04-17 (×3): 15 mg via INTRAVENOUS
  Administered 2018-04-17: 10 mg via INTRAVENOUS

## 2018-04-17 SURGICAL SUPPLY — 88 items
APPLICATOR COTTON TIP 6 STRL (MISCELLANEOUS) IMPLANT
APPLICATOR COTTON TIP 6IN STRL (MISCELLANEOUS)
BAG DECANTER FOR FLEXI CONT (MISCELLANEOUS) IMPLANT
BENZOIN TINCTURE PRP APPL 2/3 (GAUZE/BANDAGES/DRESSINGS) ×6 IMPLANT
BINDER ABDOMINAL 10 UNV 27-48 (MISCELLANEOUS) IMPLANT
BINDER ABDOMINAL 12 SM 30-45 (SOFTGOODS) IMPLANT
BLADE HEX COATED 2.75 (ELECTRODE) IMPLANT
BLADE KNIFE PERSONA 10 (BLADE) ×3 IMPLANT
BLADE KNIFE PERSONA 15 (BLADE) ×3 IMPLANT
BNDG COHESIVE 4X5 TAN STRL (GAUZE/BANDAGES/DRESSINGS) IMPLANT
CANISTER SUCT 1200ML W/VALVE (MISCELLANEOUS) ×3 IMPLANT
COMPRESSION GARMENT LG MOREWEL (MISCELLANEOUS) IMPLANT
COTTONBALL LRG STERILE PKG (GAUZE/BANDAGES/DRESSINGS) ×3 IMPLANT
COVER BACK TABLE 60X90IN (DRAPES) ×3 IMPLANT
COVER MAYO STAND STRL (DRAPES) ×3 IMPLANT
COVER WAND RF STERILE (DRAPES) IMPLANT
DRAIN CHANNEL 10F 3/8 F FF (DRAIN) IMPLANT
DRAPE LAPAROSCOPIC ABDOMINAL (DRAPES) ×3 IMPLANT
DRAPE UTILITY XL STRL (DRAPES) IMPLANT
DRSG PAD ABDOMINAL 8X10 ST (GAUZE/BANDAGES/DRESSINGS) ×12 IMPLANT
ELECT BLADE 6.5 EXT (BLADE) IMPLANT
ELECT REM PT RETURN 9FT ADLT (ELECTROSURGICAL) ×3
ELECTRODE REM PT RTRN 9FT ADLT (ELECTROSURGICAL) ×1 IMPLANT
EVACUATOR 3/16  PVC DRAIN (DRAIN) ×2
EVACUATOR 3/16 PVC DRAIN (DRAIN) ×1 IMPLANT
EVACUATOR SILICONE 100CC (DRAIN) IMPLANT
FILTER 7/8 IN (FILTER) ×3 IMPLANT
GAUZE SPONGE 4X4 12PLY STRL (GAUZE/BANDAGES/DRESSINGS) ×6 IMPLANT
GAUZE XEROFORM 5X9 LF (GAUZE/BANDAGES/DRESSINGS) ×6 IMPLANT
GLOVE BIO SURGEON STRL SZ 6.5 (GLOVE) IMPLANT
GLOVE BIO SURGEONS STRL SZ 6.5 (GLOVE)
GLOVE BIOGEL M STRL SZ7.5 (GLOVE) ×3 IMPLANT
GLOVE BIOGEL PI IND STRL 8 (GLOVE) ×1 IMPLANT
GLOVE BIOGEL PI INDICATOR 8 (GLOVE) ×2
GLOVE ECLIPSE 7.0 STRL STRAW (GLOVE) ×6 IMPLANT
GOWN STRL REUS W/ TWL LRG LVL3 (GOWN DISPOSABLE) IMPLANT
GOWN STRL REUS W/ TWL XL LVL3 (GOWN DISPOSABLE) ×1 IMPLANT
GOWN STRL REUS W/TWL LRG LVL3 (GOWN DISPOSABLE)
GOWN STRL REUS W/TWL XL LVL3 (GOWN DISPOSABLE) ×2
HOLDER FOLEY CATH W/STRAP (MISCELLANEOUS) IMPLANT
IV LACTATED RINGERS 1000ML (IV SOLUTION) ×6 IMPLANT
IV NS 500ML (IV SOLUTION)
IV NS 500ML BAXH (IV SOLUTION) IMPLANT
LINER CANISTER 1000CC FLEX (MISCELLANEOUS) ×3 IMPLANT
MARKER SKIN DUAL TIP RULER LAB (MISCELLANEOUS) ×3 IMPLANT
NDL SAFETY ECLIPSE 18X1.5 (NEEDLE) ×1 IMPLANT
NEEDLE HYPO 18GX1.5 SHARP (NEEDLE) ×2
NEEDLE SPNL 18GX3.5 QUINCKE PK (NEEDLE) ×3 IMPLANT
NS IRRIG 1000ML POUR BTL (IV SOLUTION) ×3 IMPLANT
PACK BASIN DAY SURGERY FS (CUSTOM PROCEDURE TRAY) ×3 IMPLANT
PEN SKIN MARKING BROAD TIP (MISCELLANEOUS) ×3 IMPLANT
PIN SAFETY STERILE (MISCELLANEOUS) ×3 IMPLANT
SHEET MEDIUM DRAPE 40X70 STRL (DRAPES) ×18 IMPLANT
SLEEVE SCD COMPRESS KNEE MED (MISCELLANEOUS) ×3 IMPLANT
SPONGE LAP 18X18 RF (DISPOSABLE) ×12 IMPLANT
STAPLER VISISTAT 35W (STAPLE) ×3 IMPLANT
STOCKINETTE IMPERVIOUS LG (DRAPES) IMPLANT
STRIP SUTURE WOUND CLOSURE 1/2 (SUTURE) ×9 IMPLANT
SUT ETHILON 3 0 FSL (SUTURE) ×3 IMPLANT
SUT ETHILON 3 0 PS 1 (SUTURE) ×3 IMPLANT
SUT MNCRL AB 3-0 PS2 18 (SUTURE) ×6 IMPLANT
SUT MON AB 2-0 CT1 36 (SUTURE) ×6 IMPLANT
SUT MON AB 5-0 PS2 18 (SUTURE) IMPLANT
SUT NOVA NAB GS-22 2 0 T19 (SUTURE) IMPLANT
SUT PDS AB 0 CT 36 (SUTURE) ×6 IMPLANT
SUT PROLENE 1 CT (SUTURE) ×6 IMPLANT
SUT PROLENE 4 0 PS 2 18 (SUTURE) ×3 IMPLANT
SUT SILK 3 0 TIES 17X18 (SUTURE)
SUT SILK 3-0 18XBRD TIE BLK (SUTURE) IMPLANT
SUT VLOC 180 0 24IN GS25 (SUTURE) ×6 IMPLANT
SUT VLOC 90 P-14 23 (SUTURE) ×6 IMPLANT
SYR 50ML LL SCALE MARK (SYRINGE) ×6 IMPLANT
SYR CONTROL 10ML LL (SYRINGE) IMPLANT
TAPE HYPAFIX 6 X30' (GAUZE/BANDAGES/DRESSINGS) ×1
TAPE HYPAFIX 6X30 (GAUZE/BANDAGES/DRESSINGS) ×2 IMPLANT
TAPE MEASURE 72IN RETRACT (INSTRUMENTS)
TAPE MEASURE LINEN 72IN RETRCT (INSTRUMENTS) IMPLANT
TOWEL GREEN STERILE FF (TOWEL DISPOSABLE) ×12 IMPLANT
TRAY FOL W/BAG SLVR 16FR STRL (SET/KITS/TRAYS/PACK) ×1 IMPLANT
TRAY FOLEY W/BAG SLVR 14FR LF (SET/KITS/TRAYS/PACK) IMPLANT
TRAY FOLEY W/BAG SLVR 16FR LF (SET/KITS/TRAYS/PACK) ×2
TUBE CONNECTING 20'X1/4 (TUBING) ×1
TUBE CONNECTING 20X1/4 (TUBING) ×2 IMPLANT
TUBING INFILTRATION IT-10001 (TUBING) IMPLANT
TUBING SET GRADUATE ASPIR 12FT (MISCELLANEOUS) ×3 IMPLANT
UNDERPAD 30X30 (UNDERPADS AND DIAPERS) ×6 IMPLANT
VAC PENCILS W/TUBING CLEAR (MISCELLANEOUS) ×3 IMPLANT
YANKAUER SUCT BULB TIP NO VENT (SUCTIONS) ×3 IMPLANT

## 2018-04-17 NOTE — Op Note (Signed)
NAME: Angel Moss, Angel Moss MEDICAL RECORD ZO:[id number] ACCOUNT [id number] DATE OF BIRTH:[birth year] FACILITY: [facility] LOCATION: [facility] PHYSICIAN:Francenia Chimenti Octavia Heir, MD  OPERATIVE REPORT  DATE OF PROCEDURE:  04/17/2018  INDICATIONS:  A 58 year old lady who has lost over 70 pounds.  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  PROCEDURES PLANNED:   1.  Panniculectomy.   2.  Repair of ventral hernia x2 repairs.   Liposuction of the lateral upper abdomen for contour improvement.    CONSENT:  All procedures in detail were explained to the patient who understands and consents to surgery understanding is 100% guarantee of any results.  The patient understands as well as possible risks and complications.  She consents to surgery and  today, 04/17/2018.  DESCRIPTION OF PROCEDURE:  Preoperatively, the patient was stood up and drawn for the panniculectomy, drawn mainly in the midline from xiphoid process to the umbilicus and from the umbilicus down to the pubic area, which had increased lipodystrophy as  well.  The outline for the excision of the panniculectomy was done.  She was then taken to the operating room and placed on the operating room table in the supine position, was given adequate general anesthesia, intubated orally.  Prep was done to the  chest, the belly and thigh areas with Hibiclens soap and solution walled off with sterile towels and drapes so as make a sterile field.  Two liters of tumescent solution was injected throughout all the areas of repair and excision.  We allowed that to  set up and then we were able to make the incision lower abdomen in the crease lines from the lateral abdomen.  All the way across.  We were then able to dissect it up to the umbilicus using sharp dissection and the Bovie unit on coagulation.  As we got  to the umbilicus, we were able to incise it clear on its own pedicles, mainly with large rakes  dissected tissue all the way up to the xiphoid process.  Liposuction was then fashioned to the upper belly removing over 800-900 mL of fatty material.  Same  was done on the sides with a total of 1100 mL.    After proper hemostasis using the Bovie unit and coagulation and may shower.  Perforators were coagulated.  I was then able to repair the ventral hernia.  There was a large right periumbilical area measured over 12 x 15 cm with a DeLee 0-Vitec suture.   Then, I oversewed that with a 0 PDS.  Next, the diastasis recti of the belly was also repaired from the  xiphoid process to the umbilicus and from the umbilicus to the pubic area with another  0-Vitec.  Lastly, the muscle was repaired with the same.   After proper hemostasis, the patient was placed in jackknife position.  Excess skin was removed on the right side first,  a huge amount and I was able to remove it from the field.  Hemostasis was maintained again with the Bovie on anticoagulation.  I was  able to then close the subcutaneous tissue with 2-0 Monocryl suture.  Skin edges were then reapproximated with a running subcuticular stitch of 3-0 Vitec.  The same was done on the left side, made an opening in the mid portion of the belly where the  line was, approximately 2 cm and  I was able to bring the preserved umbilicus through and repaired with multiple sutures of  3-0 nylon.  The left side was also closed likewise.  The wounds were cleansed.  Half inch Steri-Strips and soft dressing were  applied after a #10 fully fluted Blake drain was placed on both sides and secured with a 4-0 Prolene suture with good suction.  The wounds were cleansed.  Half inch Steri-Strips were applied.  Xeroform, 4 x 4's, ABDs, Hypafix tape and 2 large abdominal  garment.  Estimated blood loss without the liposuction done probably around 2-300 mL.    She tolerated all procedures very well and estimated no complications.    After she was prepared in the OR,  she will be  placed on the ICU bed for overnight stay in the semi-sitting jackknife position.  COMPLICATIONS:  None.  ESTIMATED BLOOD LOSS:  As above.  AN/NUANCE  D:04/17/2018 T:04/17/2018 JOB:[id number]

## 2018-04-17 NOTE — H&P (Signed)
Angel Moss is an 58 y.o. female.   Chief Complaint: increased panniculus with ventral hernia HPI: Incresed intertrigo with severe panniculitis  Weakness right umbilicus  Past Medical History:  Diagnosis Date  . Anxiety   . Asthma   . Cervical cancer ([place]) 1989  . Complication of anesthesia   . COPD (chronic obstructive pulmonary disease) ([place])   . Depression    with panic attacks  . Dyspnea    with exertion  . Dysrhythmia    FLUTTERS WHEN ANXIOUS  . GERD (gastroesophageal reflux disease)   . Hirsutism   . History of kidney stones   . Hyperlipidemia   . Hypertension   . Mitral valve prolapse   . Obesity   . Ovarian cyst   . Panic attacks   . PONV (postoperative nausea and vomiting)    not with recent surgeries  . RAD (reactive airway disease)   . Sleep apnea    Severe OSA- AHI 32.4, do NOT use CPAP    Past Surgical History:  Procedure Laterality Date  . CARPAL TUNNEL RELEASE  2011  . CERVICAL CONE BIOPSY    . CHOLECYSTECTOMY  04/14/2012   Completed by [name], M.D.: Chronic cholecystitis and cholelithiasis  . COLONOSCOPY     precancerous polyp  . COLONOSCOPY  2015  . COLONOSCOPY WITH PROPOFOL N/A 01/20/2018   Procedure: COLONOSCOPY WITH PROPOFOL;  Surgeon: Manya Silvas, MD;  Location: [facility] ENDOSCOPY;  Service: Endoscopy;  Laterality: N/A;  . eyebrow lift  12/2011  . HERNIA REPAIR  06/02/2015   6.4 cm Ventralite ST mesh at ventral hernia at umbilicus.   Marland Kitchen KIDNEY STONE SURGERY Left 11/23/2011     1.3 cm left superior pole stone removed via nephrolithotomy by [name], M.D.  . TONSILLECTOMY AND ADENOIDECTOMY    . VAGINAL HYSTERECTOMY  1989   partial  . VENTRAL HERNIA REPAIR N/A 06/02/2015   Procedure: HERNIA REPAIR VENTRAL ADULT;  Surgeon: [name], MD;  Location: [facility] ORS;  Service: General;  Laterality: N/A;    Family History  Problem Relation Age of Onset  . Hypertension Mother   . Heart disease Father   . Cancer Paternal Grandmother         breast  . Breast cancer Paternal Grandmother   . Diabetes Neg Hx    Social History:  reports that she quit smoking about 4 weeks ago. Her smoking use included cigarettes. She has a 2.50 pack-year smoking history. She has never used smokeless tobacco. She reports that she does not drink alcohol or use drugs.  Allergies:  Allergies  Allergen Reactions  . Hydrocodone Nausea And Vomiting and Nausea Only  . Oxycodone Hives and Nausea And Vomiting    Medications Prior to Admission  Medication Sig Dispense Refill  . ALPRAZolam (XANAX) 0.5 MG tablet Take 0.5 mg by mouth 2 (two) times daily.     Marland Kitchen aspirin EC 81 MG tablet Take by mouth.    . esomeprazole (NEXIUM) 40 MG capsule TAKE ONE CAPSULE BY MOUTH TWICE DAILY 180 capsule 0  . furosemide (LASIX) 20 MG tablet Take 20 mg by mouth daily.    . hydrochlorothiazide (HYDRODIURIL) 25 MG tablet Take by mouth.    . metoprolol succinate (TOPROL-XL) 50 MG 24 hr tablet TAKE ONE TABLET BY MOUTH ONCE DAILY FOLLOWING A MEAL (Patient taking differently: Take 50 mg by mouth 2 (two) times daily. TAKE 1.5 TABLETS BY MOUTH TWICE A DAY FOLLOWING A MEAL) 90 tablet 1  .  rosuvastatin (CRESTOR) 20 MG tablet Take 1 tablet (20 mg total) by mouth daily. (Patient taking differently: Take 20 mg by mouth at bedtime. ) 30 tablet 5  . sertraline (ZOLOFT) 100 MG tablet Take 100 mg by mouth 2 (two) times daily.     Marland Kitchen albuterol (PROVENTIL HFA;VENTOLIN HFA) 108 (90 Base) MCG/ACT inhaler Inhale into the lungs every 6 (six) hours as needed for wheezing or shortness of breath.    . fluticasone (FLOVENT HFA) 110 MCG/ACT inhaler Inhale 2 puffs into the lungs as needed.       No results found for this or any previous visit (from the past 48 hour(s)). No results found.  Review of Systems  Constitutional: Negative.   HENT: Negative.   Eyes: Negative.   Respiratory: Negative.   Cardiovascular: Negative.   Gastrointestinal: Negative.   Genitourinary: Negative.    Musculoskeletal: Positive for back pain.  Skin: Positive for itching and rash.  Neurological: Negative.   Endo/Heme/Allergies: Negative.   Psychiatric/Behavioral: Negative.     Blood pressure (!) 137/93, pulse 66, temperature 98 F (36.7 C), temperature source Oral, resp. rate 20, height 5\' 6"  (1.676 m), weight 104.5 kg, SpO2 98 %. Physical Exam  Constitutional: She appears well-developed and well-nourished.  HENT:  Head: Normocephalic and atraumatic.  Eyes: Pupils are equal, round, and reactive to light. Conjunctivae and EOM are normal.  Neck: Normal range of motion. Neck supple. No thyromegaly present.  Cardiovascular: Normal rate, regular rhythm and normal heart sounds.  Respiratory: Effort normal and breath sounds normal.  GI: Soft. Bowel sounds are normal.  Musculoskeletal: Normal range of motion.  Neurological: She is alert. She has normal reflexes.  Skin: Skin is warm.  Psychiatric: She has a normal mood and affect.  increased panniculus lipodystrophy   Assessment/Plan Severe panniculus with ventral hernia   Colt Martelle L, MD 04/17/2018, 7:21 AM

## 2018-04-17 NOTE — Brief Op Note (Signed)
04/17/2018  10:41 AM  PATIENT:  Angel Moss  58 y.o. female  PRE-OPERATIVE DIAGNOSIS:  ventral hernia  POST-OPERATIVE DIAGNOSIS:  Increased panniculus and ventral hernia  PROCEDURE:  Procedure(s): ABDOMINOPLASTY with repair of ventral hernia (N/A)  SURGEON:  Surgeon(s) and Role:    Cristine Polio, MD - Primary  PHYSICIAN ASSISTANT:   ASSISTANTS: none   ANESTHESIA:   general  EBL:  200 mL   BLOOD ADMINISTERED:none  DRAINS: (10 fully) Hemovact drain(s) in the 10 fully fluted with  Suction Open   LOCAL MEDICATIONS USED:  LIDOCAINE   SPECIMEN:  Excision  DISPOSITION OF SPECIMEN:  PATHOLOGY  COUNTS:  YES  TOURNIQUET:  * No tourniquets in log *  DICTATION: .Other Dictation: Dictation Number     [id number]  PLAN OF CARE: Admit for overnight observation  PATIENT DISPOSITION:  PACU - hemodynamically stable.   Delay start of Pharmacological VTE agent (>24hrs) due to surgical blood loss or risk of bleeding: yes

## 2018-04-17 NOTE — Anesthesia Postprocedure Evaluation (Signed)
Anesthesia Post Note  Patient: Angel Moss  Procedure(s) Performed: ABDOMINOPLASTY with repair of ventral hernia (N/A Abdomen)     Patient location during evaluation: PACU Anesthesia Type: General Level of consciousness: awake and alert Pain management: pain level controlled Vital Signs Assessment: post-procedure vital signs reviewed and stable Respiratory status: spontaneous breathing, nonlabored ventilation, respiratory function stable and patient connected to nasal cannula oxygen Cardiovascular status: blood pressure returned to baseline and stable Postop Assessment: no apparent nausea or vomiting Anesthetic complications: no    Last Vitals:  Vitals:   04/17/18 1215 04/17/18 1245  BP: 123/68 (!) 93/51  Pulse: 92 90  Resp: 16 14  Temp:  36.7 C  SpO2: 95% 95%    Last Pain:  Vitals:   04/17/18 1245  TempSrc:   PainSc: 0-No pain                 Catalina Gravel

## 2018-04-17 NOTE — Anesthesia Preprocedure Evaluation (Addendum)
Anesthesia Evaluation  Patient identified by MRN, date of birth, ID band Patient awake    Reviewed: Allergy & Precautions, NPO status , Patient's Chart, lab work & pertinent test results, reviewed documented beta blocker date and time   History of Anesthesia Complications (+) PONVNegative for: history of anesthetic complications  Airway Mallampati: I  TM Distance: >3 FB Neck ROM: Full    Dental no notable dental hx. (+) Teeth Intact, Dental Advisory Given   Pulmonary asthma , sleep apnea , COPD, former smoker,    Pulmonary exam normal        Cardiovascular hypertension, Pt. on medications and Pt. on home beta blockers negative cardio ROS Normal cardiovascular exam     Neuro/Psych PSYCHIATRIC DISORDERS Anxiety Depression negative neurological ROS     GI/Hepatic Neg liver ROS, GERD  ,  Endo/Other  negative endocrine ROS  Renal/GU negative Renal ROS  negative genitourinary   Musculoskeletal negative musculoskeletal ROS (+)   Abdominal (+) + obese,   Peds  Hematology negative hematology ROS (+)   Anesthesia Other Findings 58 yo F for abdominoplasty/ventral hernia repair - PMH: anx/dep, COPD/asthma, severe OSA (noncompliant with CPAP), HTN, GERD, BMI 37 - normal myoview and unremarkable echo 2014  Reproductive/Obstetrics                            Anesthesia Physical Anesthesia Plan  ASA: III  Anesthesia Plan: General   Post-op Pain Management:    Induction: Intravenous  PONV Risk Score and Plan: 4 or greater and Ondansetron, Dexamethasone, Midazolam and Treatment may vary due to age or medical condition  Airway Management Planned: Oral ETT  Additional Equipment: None  Intra-op Plan:   Post-operative Plan: Extubation in OR  Informed Consent: I have reviewed the patients History and Physical, chart, labs and discussed the procedure including the risks, benefits and alternatives  for the proposed anesthesia with the patient or authorized representative who has indicated his/her understanding and acceptance.     Dental advisory given  Plan Discussed with:   Anesthesia Plan Comments:        Anesthesia Quick Evaluation

## 2018-04-17 NOTE — Discharge Instructions (Signed)
Activity (include date of return to work if known) As tolerated: NO showers NO driving No heavy activities  Diet:regular No restrictions:  Wound Care: Keep dressing clean & dry  Do not change dressings For Abdominoplasties wear abdominal binder Special Instructions: Do not raise arms up Continue to empty, recharge, & record drainage from J-P drains &/or Hemovacs 2-3 times a day, as needed. Call Doctor if any unusual problems occur such as pain, excessive Bleeding, unrelieved Nausea/vomiting, Fever &/or chills When lying down, keep head elevated on 2-3 pillows or back-rest For Addominoplasties the Jack-knife position Follow-up appointment: Please call the office.  The patient received discharge instruction from:___________________________________________   Patient signature ________________________________________ / Date___________    Signature of individual providing instructions/ Date________________             Post Anesthesia Home Care Instructions  Activity: Get plenty of rest for the remainder of the day. A responsible individual must stay with you for 24 hours following the procedure.  For the next 24 hours, DO NOT: -Drive a car -Paediatric nurse -Drink alcoholic beverages -Take any medication unless instructed by your physician -Make any legal decisions or sign important papers.  Meals: Start with liquid foods such as gelatin or soup. Progress to regular foods as tolerated. Avoid greasy, spicy, heavy foods. If nausea and/or vomiting occur, drink only clear liquids until the nausea and/or vomiting subsides. Call your physician if vomiting continues.  Special Instructions/Symptoms: Your throat may feel dry or sore from the anesthesia or the breathing tube placed in your throat during surgery. If this causes discomfort, gargle with warm salt water. The discomfort should disappear within 24 hours.  If you had a scopolamine patch placed behind your ear for the  management of post- operative nausea and/or vomiting:  1. The medication in the patch is effective for 72 hours, after which it should be removed.  Wrap patch in a tissue and discard in the trash. Wash hands thoroughly with soap and water. 2. You may remove the patch earlier than 72 hours if you experience unpleasant side effects which may include dry mouth, dizziness or visual disturbances. 3. Avoid touching the patch. Wash your hands with soap and water after contact with the patch.       JP Drain [employer] this sheet to all of your post-operative appointments while you have your drains.  Please measure your drains by CC's or ML's.  Make sure you drain and measure your JP Drains 2 or 3 times per day.  At the end of each day, add up totals for the left side and add up totals for the right side.    ( 9 am )     ( 3 pm )        ( 9 pm )                Date L  R  L  R  L  R  Total L/R

## 2018-04-17 NOTE — Anesthesia Procedure Notes (Signed)
Procedure Name: Intubation Performed by: Verita Lamb, CRNA Pre-anesthesia Checklist: Patient identified, Emergency Drugs available, Suction available, Patient being monitored and Timeout performed Patient Re-evaluated:Patient Re-evaluated prior to induction Oxygen Delivery Method: Circle system utilized Preoxygenation: Pre-oxygenation with 100% oxygen Induction Type: IV induction Ventilation: Mask ventilation without difficulty Laryngoscope Size: Glidescope Grade View: Grade I Tube type: Oral Nasal Tubes: Right Tube size: 7.0 mm Number of attempts: 1 Airway Equipment and Method: Stylet and Video-laryngoscopy Placement Confirmation: ETT inserted through vocal cords under direct vision,  positive ETCO2,  breath sounds checked- equal and bilateral and CO2 detector Secured at: 22 cm Tube secured with: Tape Dental Injury: Teeth and Oropharynx as per pre-operative assessment  Comments: Smooth iv induction with roc, easy mask vent with oa.  Intubated atraumatically with [email or web address]

## 2018-04-17 NOTE — Progress Notes (Signed)
Attempts made to discontinue face mask, Oxygen sat dipping into high 80's, Incentive spirometry given and return demonstration noted, 1750 goal reached, nasal O2 placed at 4L

## 2018-04-17 NOTE — Transfer of Care (Signed)
Immediate Anesthesia Transfer of Care Note  Patient: Angel Moss  Procedure(s) Performed: ABDOMINOPLASTY with repair of ventral hernia (N/A Abdomen)  Patient Location: PACU  Anesthesia Type:General  Level of Consciousness: awake, alert  and oriented  Airway & Oxygen Therapy: Patient Spontanous Breathing and Patient connected to face mask oxygen  Post-op Assessment: Report given to RN and Post -op Vital signs reviewed and stable  Post vital signs: Reviewed and stable  Last Vitals:  Vitals Value Taken Time  BP 138/81 04/17/2018 11:12 AM  Temp    Pulse 93 04/17/2018 11:16 AM  Resp 20 04/17/2018 11:16 AM  SpO2 93 % 04/17/2018 11:16 AM  Vitals shown include unvalidated device data.  Last Pain:  Vitals:   04/17/18 6226  TempSrc: Oral  PainSc: 0-No pain         Complications: No apparent anesthesia complications

## 2018-04-18 ENCOUNTER — Encounter (HOSPITAL_BASED_OUTPATIENT_CLINIC_OR_DEPARTMENT_OTHER): Payer: Self-pay | Admitting: Specialist

## 2018-04-18 DIAGNOSIS — E65 Localized adiposity: Secondary | ICD-10-CM | POA: Diagnosis not present

## 2018-12-15 DIAGNOSIS — Z0000 Encounter for general adult medical examination without abnormal findings: Secondary | ICD-10-CM | POA: Diagnosis not present

## 2018-12-15 DIAGNOSIS — F17200 Nicotine dependence, unspecified, uncomplicated: Secondary | ICD-10-CM | POA: Diagnosis not present

## 2018-12-15 DIAGNOSIS — I10 Essential (primary) hypertension: Secondary | ICD-10-CM | POA: Diagnosis not present

## 2018-12-15 DIAGNOSIS — F419 Anxiety disorder, unspecified: Secondary | ICD-10-CM | POA: Diagnosis not present

## 2018-12-15 DIAGNOSIS — Z79899 Other long term (current) drug therapy: Secondary | ICD-10-CM | POA: Diagnosis not present

## 2018-12-15 DIAGNOSIS — R8290 Unspecified abnormal findings in urine: Secondary | ICD-10-CM | POA: Diagnosis not present

## 2018-12-15 DIAGNOSIS — E785 Hyperlipidemia, unspecified: Secondary | ICD-10-CM | POA: Diagnosis not present

## 2018-12-15 DIAGNOSIS — R739 Hyperglycemia, unspecified: Secondary | ICD-10-CM | POA: Diagnosis not present

## 2018-12-15 DIAGNOSIS — F329 Major depressive disorder, single episode, unspecified: Secondary | ICD-10-CM | POA: Diagnosis not present

## 2018-12-18 ENCOUNTER — Other Ambulatory Visit: Payer: Self-pay | Admitting: Internal Medicine

## 2018-12-18 DIAGNOSIS — Z1231 Encounter for screening mammogram for malignant neoplasm of breast: Secondary | ICD-10-CM

## 2019-01-03 DIAGNOSIS — H524 Presbyopia: Secondary | ICD-10-CM | POA: Diagnosis not present

## 2019-08-03 DIAGNOSIS — Z20828 Contact with and (suspected) exposure to other viral communicable diseases: Secondary | ICD-10-CM | POA: Diagnosis not present

## 2019-08-03 DIAGNOSIS — Z03818 Encounter for observation for suspected exposure to other biological agents ruled out: Secondary | ICD-10-CM | POA: Diagnosis not present

## 2019-08-17 DIAGNOSIS — R079 Chest pain, unspecified: Secondary | ICD-10-CM | POA: Diagnosis not present

## 2019-08-17 DIAGNOSIS — E782 Mixed hyperlipidemia: Secondary | ICD-10-CM | POA: Diagnosis not present

## 2019-08-17 DIAGNOSIS — Z0000 Encounter for general adult medical examination without abnormal findings: Secondary | ICD-10-CM | POA: Diagnosis not present

## 2019-08-17 DIAGNOSIS — J449 Chronic obstructive pulmonary disease, unspecified: Secondary | ICD-10-CM | POA: Diagnosis not present

## 2019-08-17 DIAGNOSIS — Z1231 Encounter for screening mammogram for malignant neoplasm of breast: Secondary | ICD-10-CM | POA: Diagnosis not present

## 2019-08-17 DIAGNOSIS — R739 Hyperglycemia, unspecified: Secondary | ICD-10-CM | POA: Diagnosis not present

## 2019-08-17 DIAGNOSIS — J431 Panlobular emphysema: Secondary | ICD-10-CM | POA: Diagnosis not present

## 2019-08-17 DIAGNOSIS — F3341 Major depressive disorder, recurrent, in partial remission: Secondary | ICD-10-CM | POA: Diagnosis not present

## 2019-08-17 DIAGNOSIS — I10 Essential (primary) hypertension: Secondary | ICD-10-CM | POA: Diagnosis not present

## 2019-08-17 DIAGNOSIS — Z79899 Other long term (current) drug therapy: Secondary | ICD-10-CM | POA: Diagnosis not present

## 2019-10-03 ENCOUNTER — Ambulatory Visit
Admission: RE | Admit: 2019-10-03 | Discharge: 2019-10-03 | Disposition: A | Discharge status: Home / Self Care | Payer: Medicare HMO | Source: Ambulatory Visit | Attending: Internal Medicine | Admitting: Internal Medicine

## 2019-10-03 DIAGNOSIS — Z1231 Encounter for screening mammogram for malignant neoplasm of breast: Secondary | ICD-10-CM | POA: Insufficient documentation

## 2019-11-30 DIAGNOSIS — I10 Essential (primary) hypertension: Secondary | ICD-10-CM | POA: Diagnosis not present

## 2019-11-30 DIAGNOSIS — E1165 Type 2 diabetes mellitus with hyperglycemia: Secondary | ICD-10-CM | POA: Diagnosis not present

## 2019-11-30 DIAGNOSIS — Z79899 Other long term (current) drug therapy: Secondary | ICD-10-CM | POA: Diagnosis not present

## 2019-11-30 DIAGNOSIS — J431 Panlobular emphysema: Secondary | ICD-10-CM | POA: Diagnosis not present

## 2019-11-30 DIAGNOSIS — E782 Mixed hyperlipidemia: Secondary | ICD-10-CM | POA: Diagnosis not present

## 2020-01-08 ENCOUNTER — Encounter: Payer: Self-pay | Admitting: Emergency Medicine

## 2020-01-08 ENCOUNTER — Emergency Department
Admission: EM | Admit: 2020-01-08 | Discharge: 2020-01-08 | Disposition: A | Discharge status: Home / Self Care | Payer: Medicare HMO | Attending: Emergency Medicine | Admitting: Emergency Medicine

## 2020-01-08 ENCOUNTER — Other Ambulatory Visit: Payer: Self-pay

## 2020-01-08 ENCOUNTER — Emergency Department: Payer: Medicare HMO

## 2020-01-08 DIAGNOSIS — Z79899 Other long term (current) drug therapy: Secondary | ICD-10-CM | POA: Diagnosis not present

## 2020-01-08 DIAGNOSIS — I10 Essential (primary) hypertension: Secondary | ICD-10-CM | POA: Diagnosis not present

## 2020-01-08 DIAGNOSIS — J449 Chronic obstructive pulmonary disease, unspecified: Secondary | ICD-10-CM | POA: Insufficient documentation

## 2020-01-08 DIAGNOSIS — Z8541 Personal history of malignant neoplasm of cervix uteri: Secondary | ICD-10-CM | POA: Diagnosis not present

## 2020-01-08 DIAGNOSIS — Z87891 Personal history of nicotine dependence: Secondary | ICD-10-CM | POA: Insufficient documentation

## 2020-01-08 DIAGNOSIS — R5383 Other fatigue: Secondary | ICD-10-CM | POA: Insufficient documentation

## 2020-01-08 DIAGNOSIS — Z20822 Contact with and (suspected) exposure to covid-19: Secondary | ICD-10-CM | POA: Insufficient documentation

## 2020-01-08 DIAGNOSIS — R110 Nausea: Secondary | ICD-10-CM

## 2020-01-08 DIAGNOSIS — J45909 Unspecified asthma, uncomplicated: Secondary | ICD-10-CM | POA: Diagnosis not present

## 2020-01-08 DIAGNOSIS — R197 Diarrhea, unspecified: Secondary | ICD-10-CM | POA: Diagnosis not present

## 2020-01-08 DIAGNOSIS — R112 Nausea with vomiting, unspecified: Secondary | ICD-10-CM | POA: Diagnosis not present

## 2020-01-08 DIAGNOSIS — Z7901 Long term (current) use of anticoagulants: Secondary | ICD-10-CM | POA: Insufficient documentation

## 2020-01-08 LAB — COMPREHENSIVE METABOLIC PANEL
ALT: 30 U/L (ref 0–44)
AST: 24 U/L (ref 15–41)
Albumin: 4.4 g/dL (ref 3.5–5.0)
Alkaline Phosphatase: 76 U/L (ref 38–126)
Anion gap: 12 (ref 5–15)
BUN: 22 mg/dL (ref 8–23)
CO2: 27 mmol/L (ref 22–32)
Calcium: 9.4 mg/dL (ref 8.9–10.3)
Chloride: 101 mmol/L (ref 98–111)
Creatinine, Ser: 0.8 mg/dL (ref 0.44–1.00)
GFR, Estimated: >60 mL/min (ref >60)
Glucose, Bld: 188 mg/dL — ABNORMAL HIGH (ref 70–99)
Potassium: 4.3 mmol/L (ref 3.5–5.1)
Sodium: 140 mmol/L (ref 135–145)
Total Bilirubin: 0.8 mg/dL (ref 0.3–1.2)
Total Protein: 7.6 g/dL (ref 6.5–8.1)

## 2020-01-08 LAB — URINALYSIS, COMPLETE (UACMP) WITH MICROSCOPIC
Bilirubin Urine: NEGATIVE
Glucose, UA: NEGATIVE mg/dL
Hgb urine dipstick: NEGATIVE
Ketones, ur: 20 mg/dL — AB
Nitrite: NEGATIVE
Protein, ur: 30 mg/dL — AB
Specific Gravity, Urine: 1.025 (ref 1.005–1.030)
pH: 7 (ref 5.0–8.0)

## 2020-01-08 LAB — CBC
HCT: 41.6 % (ref 36.0–46.0)
Hemoglobin: 14.6 g/dL (ref 12.0–15.0)
MCH: 28 pg (ref 26.0–34.0)
MCHC: 35.1 g/dL (ref 30.0–36.0)
MCV: 79.8 fL — ABNORMAL LOW (ref 80.0–100.0)
Platelets: 163 10*3/uL (ref 150–400)
RBC: 5.21 MIL/uL — ABNORMAL HIGH (ref 3.87–5.11)
RDW: 13.2 % (ref 11.5–15.5)
WBC: 10.6 10*3/uL — ABNORMAL HIGH (ref 4.0–10.5)
nRBC: 0 % (ref 0.0–0.2)

## 2020-01-08 LAB — RESPIRATORY PANEL BY RT PCR (FLU A&B, COVID)
Influenza A by PCR: NEGATIVE
Influenza B by PCR: NEGATIVE
SARS Coronavirus 2 by RT PCR: NEGATIVE

## 2020-01-08 LAB — LIPASE, BLOOD: Lipase: 25 U/L (ref 11–51)

## 2020-01-08 MED ORDER — LACTATED RINGERS IV BOLUS: 1000.0000 mL | Freq: Once | INTRAVENOUS | Status: DC

## 2020-01-08 MED ORDER — PROMETHAZINE HCL 25 MG/ML IJ SOLN
25.0000 mg | Freq: Once | INTRAMUSCULAR | Status: DC
  Filled 2020-01-08: qty 1

## 2020-01-08 MED ORDER — SODIUM CHLORIDE 0.9 % IV BOLUS
1000.0000 mL | Freq: Once | INTRAVENOUS | Status: AC
  Administered 2020-01-08: 1000 mL via INTRAVENOUS

## 2020-01-08 MED ORDER — PROMETHAZINE HCL 25 MG PO TABS: 25.0000 mg | ORAL_TABLET | Freq: Four times a day (QID) | ORAL | 0 refills | Status: AC | PRN

## 2020-01-08 MED ORDER — ONDANSETRON HCL 4 MG/2ML IJ SOLN
4.0000 mg | Freq: Once | INTRAMUSCULAR | Status: AC | PRN
  Administered 2020-01-08: 4 mg via INTRAVENOUS
  Filled 2020-01-08: qty 2

## 2020-01-08 MED ORDER — DOXYCYCLINE HYCLATE 100 MG PO TABS: 100.0000 mg | ORAL_TABLET | Freq: Two times a day (BID) | ORAL | 0 refills | Status: DC

## 2020-01-08 NOTE — ED Provider Notes (Signed)
[facility] Emergency Department Provider Note  Time seen: 3:04 PM  I have reviewed the triage vital signs and the nursing notes.   HISTORY  Chief Complaint Weakness, Emesis, and Diarrhea   HPI Angel Moss is a 59 y.o. female medical history of anxiety, COPD, hypertension, hyperlipidemia, presents to the emergency department for nausea vomiting diarrhea and generalized fatigue.  According to the patient for the past 2 days she has had nausea vomiting and diarrhea along with generalized weakness/fatigue.  Patient denies any chest pain shortness of breath cough or fever.  Patient is fully vaccinated against Covid.  Patient denies any known sick contacts.  Patient lives with her elderly mother who is not ill.  Patient denies any abdominal pain.   Past Medical History:  Diagnosis Date  . Anxiety   . Asthma   . Cervical cancer ([place]) 1989  . Complication of anesthesia   . COPD (chronic obstructive pulmonary disease) ([place])   . Depression    with panic attacks  . Dyspnea    with exertion  . Dysrhythmia    FLUTTERS WHEN ANXIOUS  . GERD (gastroesophageal reflux disease)   . Hirsutism   . History of kidney stones   . Hyperlipidemia   . Hypertension   . Mitral valve prolapse   . Obesity   . Ovarian cyst   . Panic attacks   . PONV (postoperative nausea and vomiting)    not with recent surgeries  . RAD (reactive airway disease)   . Sleep apnea    Severe OSA- AHI 32.4, do NOT use CPAP    Patient Active Problem List   Diagnosis Date Noted  . Status post panniculectomy 04/17/2018  . Ventral hernia without obstruction or gangrene 05/16/2015  . Clinical depression 11/20/2014  . History of colon polyps 11/20/2014  . History of prolonged Q-T interval on ECG 11/20/2014  . RAD (reactive airway disease) 11/20/2014  . H/O renal calculi 03/25/2014  . Combined fat and carbohydrate induced hyperlipemia 03/25/2014  . Medicare annual wellness visit, subsequent  09/10/2013  . Right knee pain 08/03/2012  . Osteoarthritis of both knees 06/06/2012  . Screening for colon cancer 06/06/2012  . Anxiety state, unspecified 06/06/2012  . Tobacco abuse 10/24/2011  . Hypertension 12/22/2010  . GERD (gastroesophageal reflux disease) 12/22/2010  . Hyperlipidemia 12/22/2010  . Obesity 12/22/2010    Past Surgical History:  Procedure Laterality Date  . ABDOMINOPLASTY N/A 04/17/2018   Procedure: ABDOMINOPLASTY with repair of ventral hernia;  Surgeon: Cristine Polio, MD;  Location: [place];  Service: Plastics;  Laterality: N/A;  . CARPAL TUNNEL RELEASE  2011  . CERVICAL CONE BIOPSY    . CHOLECYSTECTOMY  04/14/2012   Completed by [name], M.D.: Chronic cholecystitis and cholelithiasis  . COLONOSCOPY     precancerous polyp  . COLONOSCOPY  2015  . COLONOSCOPY WITH PROPOFOL N/A 01/20/2018   Procedure: COLONOSCOPY WITH PROPOFOL;  Surgeon: Manya Silvas, MD;  [facility] ENDOSCOPY;  Service: Endoscopy;  Laterality: N/A;  . eyebrow lift  12/2011  . HERNIA REPAIR  06/02/2015   6.4 cm Ventralite ST mesh at ventral hernia at umbilicus.   Marland Kitchen KIDNEY STONE SURGERY Left 11/23/2011     1.3 cm left superior pole stone removed via nephrolithotomy by [name], M.D.  . TONSILLECTOMY AND ADENOIDECTOMY    . VAGINAL HYSTERECTOMY  1989   partial  . VENTRAL HERNIA REPAIR N/A 06/02/2015   Procedure: HERNIA REPAIR VENTRAL ADULT;  Surgeon: Dellis Filbert  Amedeo Kinsman, MD;  Location: [facility] ORS;  Service: General;  Laterality: N/A;    Prior to Admission medications   Medication Sig Start Date End Date Taking? Authorizing Provider  albuterol (PROVENTIL HFA;VENTOLIN HFA) 108 (90 Base) MCG/ACT inhaler Inhale into the lungs every 6 (six) hours as needed for wheezing or shortness of breath.    [provider]  ALPRAZolam Duanne Moron) 0.5 MG tablet Take 0.5 mg by mouth 2 (two) times daily.     [provider]  esomeprazole (NEXIUM) 40 MG capsule TAKE ONE  CAPSULE BY MOUTH TWICE DAILY 04/23/14   Jackolyn Confer, MD  fluticasone (FLOVENT HFA) 110 MCG/ACT inhaler Inhale 2 puffs into the lungs as needed.     [provider]  furosemide (LASIX) 20 MG tablet Take 20 mg by mouth daily.    [provider]  hydrochlorothiazide (HYDRODIURIL) 25 MG tablet Take by mouth. 10/09/14   [provider]  metoprolol succinate (TOPROL-XL) 50 MG 24 hr tablet TAKE ONE TABLET BY MOUTH ONCE DAILY FOLLOWING A MEAL Patient taking differently: Take 50 mg by mouth 2 (two) times daily. TAKE 1.5 TABLETS BY MOUTH TWICE A DAY FOLLOWING A MEAL 03/18/14   Jackolyn Confer, MD  sertraline (ZOLOFT) 100 MG tablet Take 100 mg by mouth 2 (two) times daily.  11/20/10   [provider]    Allergies  Allergen Reactions  . Hydrocodone Nausea And Vomiting and Nausea Only  . Oxycodone Hives and Nausea And Vomiting    Family History  Problem Relation Age of Onset  . Hypertension Mother   . Heart disease Father   . Cancer Paternal Grandmother        breast  . Breast cancer Paternal Grandmother   . Diabetes Neg Hx     Social History Social History   Tobacco Use  . Smoking status: Former Smoker    Packs/day: 0.25    Years: 10.00    Pack years: 2.50    Types: Cigarettes    Quit date: 03/14/2018    Years since quitting: 1.8  . Smokeless tobacco: Never Used  Vaping Use  . Vaping Use: Never used  Substance Use Topics  . Alcohol use: No  . Drug use: No    Review of Systems Constitutional: Negative for fever.  Generalized fatigue. Cardiovascular: Negative for chest pain. Respiratory: Negative for shortness of breath. Gastrointestinal: Negative for abdominal pain.  Positive nausea vomiting diarrhea x2 days. Genitourinary: Negative for urinary compaints Musculoskeletal: Negative for musculoskeletal complaints Skin: Small area of infection to her right inner thigh. Neurological: Negative for headache All other ROS  negative  ____________________________________________   PHYSICAL EXAM:  VITAL SIGNS: ED Triage Vitals  Enc Vitals Group     BP 01/08/20 1135 (!) 152/87     Pulse Rate 01/08/20 1135 63     Resp 01/08/20 1135 18     Temp 01/08/20 1135 98.6 F (37 C)     Temp Source 01/08/20 1135 Oral     SpO2 01/08/20 1123 99 %     Weight 01/08/20 1137 230 lb 6.1 oz (104.5 kg)     Height --      Head Circumference --      Peak Flow --      Pain Score --      Pain Loc --      Pain Edu? --      Excl. in [place]? --     Constitutional: Alert and oriented. Well appearing and in  no distress. Eyes: Normal exam ENT      Head: Normocephalic and atraumatic.      Mouth/Throat: Mucous membranes are moist. Cardiovascular: Normal rate, regular rhythm. No murmur Respiratory: Normal respiratory effort without tachypnea nor retractions. Breath sounds are clear Gastrointestinal: Soft and nontender. No distention.   Musculoskeletal: Nontender with normal range of motion in all extremities. Neurologic:  Normal speech and language. No gross focal neurologic deficits  Skin: Patient has an approximate 1.5 cm diameter area of erythema to her right inner thigh that is consistent with likely resolving abscess or mild cellulitis.  There is no fluctuance or signs of drainable abscess at this time. Psychiatric: Mood and affect are normal.   ____________________________________________    EKG  EKG viewed and interpreted by myself shows a normal sinus rhythm at 63 bpm with a narrow QRS, normal axis, normal intervals, no concerning ST changes.  ____________________________________________    RADIOLOGY  X-ray shows no concerning findings.  ____________________________________________   INITIAL IMPRESSION / ASSESSMENT AND PLAN / ED COURSE  Pertinent labs & imaging results that were available during my care of the patient were reviewed by me and considered in my medical decision making (see chart for details).    Patient presents emergency department for nausea vomiting diarrhea since yesterday.  Also has a very small area of infection to her right inner thigh.  Patient is diabetic, but overall well controlled blood sugars 188 in the emergency department.  The remainder the patient's lab work is largely nonrevealing besides ketones in her urinalysis consistent with dehydration.  We will treat the patient's nausea, IV hydrate the patient.  We will check a Covid swab as a precaution as well as a two-view abdominal x-ray.  Patient agreeable.   Covid test is negative.  Patient states she is feeling much better.  No longer feels nauseous.  We will discharge patient home with Phenergan and PCP follow-up.  Patient agreeable.  Angel Moss was evaluated in Emergency Department on 01/08/2020 for the symptoms described in the history of present illness. She was evaluated in the context of the global COVID-19 pandemic, which necessitated consideration that the patient might be at risk for infection with the SARS-CoV-2 virus that causes COVID-19. Institutional protocols and algorithms that pertain to the evaluation of patients at risk for COVID-19 are in a state of rapid change based on information released by regulatory bodies including the CDC and federal and state organizations. These policies and algorithms were followed during the patient's care in the ED.  ____________________________________________   FINAL CLINICAL IMPRESSION(S) / ED DIAGNOSES  Nausea vomiting diarrhea   Harvest Dark, MD 01/08/20 1658

## 2020-01-08 NOTE — ED Triage Notes (Signed)
Pt in via [facility] w/n/v/d x 3 days. States she woke up with intractable nausea at 0500 this am. Reports >10 episodes of diarrhea in past day. Does have DM, CBG per EMS 165. Denies any fevers or sick contacts. C/o generalized epigastric pain. Bile emesis noted in bag

## 2020-01-08 NOTE — ED Notes (Signed)
Pt reports symptoms described in triage note. Denies current nausea. Pt reports her A1C has been elevated, and has gone from 12 two months ago to 9 this month. Pt A&O x4, NAD at this time

## 2020-01-08 NOTE — ED Notes (Signed)
Pt to CT

## 2020-01-15 ENCOUNTER — Other Ambulatory Visit: Payer: Self-pay

## 2020-01-16 DIAGNOSIS — E1165 Type 2 diabetes mellitus with hyperglycemia: Secondary | ICD-10-CM | POA: Diagnosis not present

## 2020-02-21 DIAGNOSIS — E1165 Type 2 diabetes mellitus with hyperglycemia: Secondary | ICD-10-CM | POA: Diagnosis not present

## 2020-02-21 DIAGNOSIS — I10 Essential (primary) hypertension: Secondary | ICD-10-CM | POA: Diagnosis not present

## 2020-02-21 DIAGNOSIS — E782 Mixed hyperlipidemia: Secondary | ICD-10-CM | POA: Diagnosis not present

## 2020-02-21 DIAGNOSIS — J431 Panlobular emphysema: Secondary | ICD-10-CM | POA: Diagnosis not present

## 2020-02-21 DIAGNOSIS — K219 Gastro-esophageal reflux disease without esophagitis: Secondary | ICD-10-CM | POA: Diagnosis not present

## 2020-08-15 DIAGNOSIS — H02834 Dermatochalasis of left upper eyelid: Secondary | ICD-10-CM | POA: Diagnosis not present

## 2020-08-15 DIAGNOSIS — E119 Type 2 diabetes mellitus without complications: Secondary | ICD-10-CM | POA: Diagnosis not present

## 2020-08-15 DIAGNOSIS — H524 Presbyopia: Secondary | ICD-10-CM | POA: Diagnosis not present

## 2020-08-15 DIAGNOSIS — E089 Diabetes mellitus due to underlying condition without complications: Secondary | ICD-10-CM | POA: Diagnosis not present

## 2020-08-21 ENCOUNTER — Other Ambulatory Visit: Payer: Self-pay | Admitting: Internal Medicine

## 2020-08-21 DIAGNOSIS — Z1231 Encounter for screening mammogram for malignant neoplasm of breast: Secondary | ICD-10-CM | POA: Diagnosis not present

## 2020-08-21 DIAGNOSIS — Z79899 Other long term (current) drug therapy: Secondary | ICD-10-CM | POA: Diagnosis not present

## 2020-08-21 DIAGNOSIS — I10 Essential (primary) hypertension: Secondary | ICD-10-CM | POA: Diagnosis not present

## 2020-08-21 DIAGNOSIS — J449 Chronic obstructive pulmonary disease, unspecified: Secondary | ICD-10-CM | POA: Diagnosis not present

## 2020-08-21 DIAGNOSIS — M545 Low back pain, unspecified: Secondary | ICD-10-CM | POA: Diagnosis not present

## 2020-08-21 DIAGNOSIS — E119 Type 2 diabetes mellitus without complications: Secondary | ICD-10-CM | POA: Diagnosis not present

## 2020-08-21 DIAGNOSIS — Z0000 Encounter for general adult medical examination without abnormal findings: Secondary | ICD-10-CM | POA: Diagnosis not present

## 2020-08-21 DIAGNOSIS — M5489 Other dorsalgia: Secondary | ICD-10-CM | POA: Diagnosis not present

## 2020-08-21 DIAGNOSIS — E785 Hyperlipidemia, unspecified: Secondary | ICD-10-CM | POA: Diagnosis not present

## 2020-10-03 ENCOUNTER — Ambulatory Visit
Admission: RE | Admit: 2020-10-03 | Discharge: 2020-10-03 | Disposition: A | Discharge status: Home / Self Care | Payer: Medicare HMO | Source: Ambulatory Visit | Attending: Internal Medicine | Admitting: Internal Medicine

## 2020-10-03 ENCOUNTER — Other Ambulatory Visit: Payer: Self-pay

## 2020-10-03 DIAGNOSIS — Z1231 Encounter for screening mammogram for malignant neoplasm of breast: Secondary | ICD-10-CM | POA: Diagnosis not present

## 2020-11-20 DIAGNOSIS — E119 Type 2 diabetes mellitus without complications: Secondary | ICD-10-CM | POA: Diagnosis not present

## 2020-11-20 DIAGNOSIS — J431 Panlobular emphysema: Secondary | ICD-10-CM | POA: Diagnosis not present

## 2020-11-20 DIAGNOSIS — I10 Essential (primary) hypertension: Secondary | ICD-10-CM | POA: Diagnosis not present

## 2020-11-20 DIAGNOSIS — E782 Mixed hyperlipidemia: Secondary | ICD-10-CM | POA: Diagnosis not present

## 2020-11-20 DIAGNOSIS — Z79899 Other long term (current) drug therapy: Secondary | ICD-10-CM | POA: Diagnosis not present

## 2021-02-17 DIAGNOSIS — E782 Mixed hyperlipidemia: Secondary | ICD-10-CM | POA: Diagnosis not present

## 2021-02-17 DIAGNOSIS — E1165 Type 2 diabetes mellitus with hyperglycemia: Secondary | ICD-10-CM | POA: Diagnosis not present

## 2021-02-17 DIAGNOSIS — I10 Essential (primary) hypertension: Secondary | ICD-10-CM | POA: Diagnosis not present

## 2021-02-17 DIAGNOSIS — Z79899 Other long term (current) drug therapy: Secondary | ICD-10-CM | POA: Diagnosis not present

## 2021-03-12 DIAGNOSIS — E119 Type 2 diabetes mellitus without complications: Secondary | ICD-10-CM | POA: Diagnosis not present

## 2021-03-12 DIAGNOSIS — E1165 Type 2 diabetes mellitus with hyperglycemia: Secondary | ICD-10-CM | POA: Diagnosis not present

## 2021-03-12 DIAGNOSIS — J431 Panlobular emphysema: Secondary | ICD-10-CM | POA: Diagnosis not present

## 2021-03-12 DIAGNOSIS — E782 Mixed hyperlipidemia: Secondary | ICD-10-CM | POA: Diagnosis not present

## 2021-03-12 DIAGNOSIS — I10 Essential (primary) hypertension: Secondary | ICD-10-CM | POA: Diagnosis not present

## 2021-07-13 ENCOUNTER — Other Ambulatory Visit: Payer: Self-pay | Admitting: Internal Medicine

## 2021-07-13 DIAGNOSIS — Z79899 Other long term (current) drug therapy: Secondary | ICD-10-CM | POA: Diagnosis not present

## 2021-07-13 DIAGNOSIS — I10 Essential (primary) hypertension: Secondary | ICD-10-CM | POA: Diagnosis not present

## 2021-07-13 DIAGNOSIS — Z1231 Encounter for screening mammogram for malignant neoplasm of breast: Secondary | ICD-10-CM | POA: Diagnosis not present

## 2021-07-13 DIAGNOSIS — J449 Chronic obstructive pulmonary disease, unspecified: Secondary | ICD-10-CM | POA: Diagnosis not present

## 2021-07-13 DIAGNOSIS — E785 Hyperlipidemia, unspecified: Secondary | ICD-10-CM | POA: Diagnosis not present

## 2021-07-13 DIAGNOSIS — E119 Type 2 diabetes mellitus without complications: Secondary | ICD-10-CM | POA: Diagnosis not present

## 2021-07-13 DIAGNOSIS — F419 Anxiety disorder, unspecified: Secondary | ICD-10-CM | POA: Diagnosis not present

## 2021-07-13 DIAGNOSIS — F32A Depression, unspecified: Secondary | ICD-10-CM | POA: Diagnosis not present

## 2021-07-13 DIAGNOSIS — Z0000 Encounter for general adult medical examination without abnormal findings: Secondary | ICD-10-CM | POA: Diagnosis not present

## 2021-09-08 DIAGNOSIS — H02831 Dermatochalasis of right upper eyelid: Secondary | ICD-10-CM | POA: Diagnosis not present

## 2021-09-08 DIAGNOSIS — E113291 Type 2 diabetes mellitus with mild nonproliferative diabetic retinopathy without macular edema, right eye: Secondary | ICD-10-CM | POA: Diagnosis not present

## 2021-09-08 DIAGNOSIS — H02834 Dermatochalasis of left upper eyelid: Secondary | ICD-10-CM | POA: Diagnosis not present

## 2021-09-09 DIAGNOSIS — Z0100 Encounter for examination of eyes and vision without abnormal findings: Secondary | ICD-10-CM | POA: Diagnosis not present

## 2021-10-05 ENCOUNTER — Ambulatory Visit
Admission: RE | Admit: 2021-10-05 | Discharge: 2021-10-05 | Disposition: A | Discharge status: Home / Self Care | Payer: Medicare HMO | Source: Ambulatory Visit | Attending: Internal Medicine | Admitting: Internal Medicine

## 2021-10-05 DIAGNOSIS — Z1231 Encounter for screening mammogram for malignant neoplasm of breast: Secondary | ICD-10-CM | POA: Diagnosis not present

## 2021-10-07 ENCOUNTER — Encounter (INDEPENDENT_AMBULATORY_CARE_PROVIDER_SITE_OTHER): Payer: Self-pay

## 2021-12-18 DIAGNOSIS — Z720 Tobacco use: Secondary | ICD-10-CM | POA: Diagnosis not present

## 2021-12-18 DIAGNOSIS — F32A Depression, unspecified: Secondary | ICD-10-CM | POA: Diagnosis not present

## 2021-12-18 DIAGNOSIS — Z79899 Other long term (current) drug therapy: Secondary | ICD-10-CM | POA: Diagnosis not present

## 2021-12-18 DIAGNOSIS — J449 Chronic obstructive pulmonary disease, unspecified: Secondary | ICD-10-CM | POA: Diagnosis not present

## 2021-12-18 DIAGNOSIS — E119 Type 2 diabetes mellitus without complications: Secondary | ICD-10-CM | POA: Diagnosis not present

## 2021-12-18 DIAGNOSIS — E785 Hyperlipidemia, unspecified: Secondary | ICD-10-CM | POA: Diagnosis not present

## 2021-12-18 DIAGNOSIS — I10 Essential (primary) hypertension: Secondary | ICD-10-CM | POA: Diagnosis not present

## 2021-12-18 DIAGNOSIS — J439 Emphysema, unspecified: Secondary | ICD-10-CM | POA: Diagnosis not present

## 2022-04-08 DIAGNOSIS — J431 Panlobular emphysema: Secondary | ICD-10-CM | POA: Diagnosis not present

## 2022-04-08 DIAGNOSIS — Z79899 Other long term (current) drug therapy: Secondary | ICD-10-CM | POA: Diagnosis not present

## 2022-04-08 DIAGNOSIS — E119 Type 2 diabetes mellitus without complications: Secondary | ICD-10-CM | POA: Diagnosis not present

## 2022-04-08 DIAGNOSIS — J449 Chronic obstructive pulmonary disease, unspecified: Secondary | ICD-10-CM | POA: Diagnosis not present

## 2022-04-08 DIAGNOSIS — F419 Anxiety disorder, unspecified: Secondary | ICD-10-CM | POA: Diagnosis not present

## 2022-04-08 DIAGNOSIS — I10 Essential (primary) hypertension: Secondary | ICD-10-CM | POA: Diagnosis not present

## 2022-04-08 DIAGNOSIS — E782 Mixed hyperlipidemia: Secondary | ICD-10-CM | POA: Diagnosis not present

## 2022-04-08 DIAGNOSIS — E1122 Type 2 diabetes mellitus with diabetic chronic kidney disease: Secondary | ICD-10-CM | POA: Diagnosis not present

## 2022-04-08 DIAGNOSIS — F17200 Nicotine dependence, unspecified, uncomplicated: Secondary | ICD-10-CM | POA: Diagnosis not present

## 2022-07-07 DIAGNOSIS — I10 Essential (primary) hypertension: Secondary | ICD-10-CM | POA: Diagnosis not present

## 2022-07-07 DIAGNOSIS — E782 Mixed hyperlipidemia: Secondary | ICD-10-CM | POA: Diagnosis not present

## 2022-07-07 DIAGNOSIS — E1165 Type 2 diabetes mellitus with hyperglycemia: Secondary | ICD-10-CM | POA: Diagnosis not present

## 2022-07-07 DIAGNOSIS — Z79899 Other long term (current) drug therapy: Secondary | ICD-10-CM | POA: Diagnosis not present

## 2022-07-13 ENCOUNTER — Other Ambulatory Visit: Payer: Self-pay

## 2022-07-13 DIAGNOSIS — Z1231 Encounter for screening mammogram for malignant neoplasm of breast: Secondary | ICD-10-CM | POA: Diagnosis not present

## 2022-07-13 DIAGNOSIS — Z79899 Other long term (current) drug therapy: Secondary | ICD-10-CM | POA: Diagnosis not present

## 2022-07-13 DIAGNOSIS — E669 Obesity, unspecified: Secondary | ICD-10-CM | POA: Diagnosis not present

## 2022-07-13 DIAGNOSIS — I10 Essential (primary) hypertension: Secondary | ICD-10-CM | POA: Diagnosis not present

## 2022-07-13 DIAGNOSIS — F32A Depression, unspecified: Secondary | ICD-10-CM | POA: Diagnosis not present

## 2022-07-13 DIAGNOSIS — E119 Type 2 diabetes mellitus without complications: Secondary | ICD-10-CM | POA: Diagnosis not present

## 2022-07-13 DIAGNOSIS — E785 Hyperlipidemia, unspecified: Secondary | ICD-10-CM | POA: Diagnosis not present

## 2022-07-13 DIAGNOSIS — Z6838 Body mass index (BMI) 38.0-38.9, adult: Secondary | ICD-10-CM | POA: Diagnosis not present

## 2022-07-13 DIAGNOSIS — J449 Chronic obstructive pulmonary disease, unspecified: Secondary | ICD-10-CM | POA: Diagnosis not present

## 2022-07-19 DIAGNOSIS — H25013 Cortical age-related cataract, bilateral: Secondary | ICD-10-CM | POA: Diagnosis not present

## 2022-07-19 DIAGNOSIS — Z0100 Encounter for examination of eyes and vision without abnormal findings: Secondary | ICD-10-CM | POA: Diagnosis not present

## 2022-07-19 DIAGNOSIS — H524 Presbyopia: Secondary | ICD-10-CM | POA: Diagnosis not present

## 2022-07-19 DIAGNOSIS — H2513 Age-related nuclear cataract, bilateral: Secondary | ICD-10-CM | POA: Diagnosis not present

## 2022-07-19 DIAGNOSIS — E119 Type 2 diabetes mellitus without complications: Secondary | ICD-10-CM | POA: Diagnosis not present

## 2022-07-26 DIAGNOSIS — H2513 Age-related nuclear cataract, bilateral: Secondary | ICD-10-CM | POA: Diagnosis not present

## 2022-10-06 DIAGNOSIS — Z79899 Other long term (current) drug therapy: Secondary | ICD-10-CM | POA: Diagnosis not present

## 2022-10-06 DIAGNOSIS — E1165 Type 2 diabetes mellitus with hyperglycemia: Secondary | ICD-10-CM | POA: Diagnosis not present

## 2022-10-06 DIAGNOSIS — I10 Essential (primary) hypertension: Secondary | ICD-10-CM | POA: Diagnosis not present

## 2022-10-06 DIAGNOSIS — E782 Mixed hyperlipidemia: Secondary | ICD-10-CM | POA: Diagnosis not present

## 2022-10-07 ENCOUNTER — Ambulatory Visit
Admission: RE | Admit: 2022-10-07 | Discharge: 2022-10-07 | Disposition: A | Discharge status: Home / Self Care | Payer: Medicare HMO | Source: Ambulatory Visit | Attending: Internal Medicine | Admitting: Internal Medicine

## 2022-10-07 DIAGNOSIS — Z1231 Encounter for screening mammogram for malignant neoplasm of breast: Secondary | ICD-10-CM | POA: Insufficient documentation

## 2022-10-13 DIAGNOSIS — J449 Chronic obstructive pulmonary disease, unspecified: Secondary | ICD-10-CM | POA: Diagnosis not present

## 2022-10-13 DIAGNOSIS — Z0000 Encounter for general adult medical examination without abnormal findings: Secondary | ICD-10-CM | POA: Diagnosis not present

## 2022-10-13 DIAGNOSIS — I10 Essential (primary) hypertension: Secondary | ICD-10-CM | POA: Diagnosis not present

## 2022-10-13 DIAGNOSIS — E119 Type 2 diabetes mellitus without complications: Secondary | ICD-10-CM | POA: Diagnosis not present

## 2022-10-13 DIAGNOSIS — E785 Hyperlipidemia, unspecified: Secondary | ICD-10-CM | POA: Diagnosis not present

## 2022-10-13 DIAGNOSIS — F17200 Nicotine dependence, unspecified, uncomplicated: Secondary | ICD-10-CM | POA: Diagnosis not present

## 2023-01-13 DIAGNOSIS — I10 Essential (primary) hypertension: Secondary | ICD-10-CM | POA: Diagnosis not present

## 2023-01-13 DIAGNOSIS — J431 Panlobular emphysema: Secondary | ICD-10-CM | POA: Diagnosis not present

## 2023-01-13 DIAGNOSIS — J449 Chronic obstructive pulmonary disease, unspecified: Secondary | ICD-10-CM | POA: Diagnosis not present

## 2023-01-13 DIAGNOSIS — E782 Mixed hyperlipidemia: Secondary | ICD-10-CM | POA: Diagnosis not present

## 2023-01-13 DIAGNOSIS — E119 Type 2 diabetes mellitus without complications: Secondary | ICD-10-CM | POA: Diagnosis not present

## 2023-01-13 DIAGNOSIS — F419 Anxiety disorder, unspecified: Secondary | ICD-10-CM | POA: Diagnosis not present

## 2023-01-13 DIAGNOSIS — F321 Major depressive disorder, single episode, moderate: Secondary | ICD-10-CM | POA: Diagnosis not present

## 2023-01-13 DIAGNOSIS — Z79899 Other long term (current) drug therapy: Secondary | ICD-10-CM | POA: Diagnosis not present

## 2023-03-23 DIAGNOSIS — Z01818 Encounter for other preprocedural examination: Secondary | ICD-10-CM | POA: Diagnosis not present

## 2023-03-23 DIAGNOSIS — Z860100 Personal history of colon polyps, unspecified: Secondary | ICD-10-CM | POA: Diagnosis not present

## 2023-04-20 DIAGNOSIS — E785 Hyperlipidemia, unspecified: Secondary | ICD-10-CM | POA: Diagnosis not present

## 2023-04-20 DIAGNOSIS — E119 Type 2 diabetes mellitus without complications: Secondary | ICD-10-CM | POA: Diagnosis not present

## 2023-04-20 DIAGNOSIS — Z79899 Other long term (current) drug therapy: Secondary | ICD-10-CM | POA: Diagnosis not present

## 2023-04-20 DIAGNOSIS — Z0000 Encounter for general adult medical examination without abnormal findings: Secondary | ICD-10-CM | POA: Diagnosis not present

## 2023-04-20 DIAGNOSIS — K219 Gastro-esophageal reflux disease without esophagitis: Secondary | ICD-10-CM | POA: Diagnosis not present

## 2023-04-20 DIAGNOSIS — F32A Depression, unspecified: Secondary | ICD-10-CM | POA: Diagnosis not present

## 2023-04-20 DIAGNOSIS — J449 Chronic obstructive pulmonary disease, unspecified: Secondary | ICD-10-CM | POA: Diagnosis not present

## 2023-04-20 DIAGNOSIS — I10 Essential (primary) hypertension: Secondary | ICD-10-CM | POA: Diagnosis not present

## 2023-05-06 ENCOUNTER — Encounter: Payer: Self-pay | Admitting: Internal Medicine

## 2023-05-10 DIAGNOSIS — E119 Type 2 diabetes mellitus without complications: Secondary | ICD-10-CM | POA: Diagnosis not present

## 2023-05-10 DIAGNOSIS — E785 Hyperlipidemia, unspecified: Secondary | ICD-10-CM | POA: Diagnosis not present

## 2023-05-10 DIAGNOSIS — Z79899 Other long term (current) drug therapy: Secondary | ICD-10-CM | POA: Diagnosis not present

## 2023-05-10 DIAGNOSIS — E1122 Type 2 diabetes mellitus with diabetic chronic kidney disease: Secondary | ICD-10-CM | POA: Diagnosis not present

## 2023-05-10 DIAGNOSIS — F32A Depression, unspecified: Secondary | ICD-10-CM | POA: Diagnosis not present

## 2023-05-10 DIAGNOSIS — I10 Essential (primary) hypertension: Secondary | ICD-10-CM | POA: Diagnosis not present

## 2023-05-10 DIAGNOSIS — J449 Chronic obstructive pulmonary disease, unspecified: Secondary | ICD-10-CM | POA: Diagnosis not present

## 2023-05-10 DIAGNOSIS — K219 Gastro-esophageal reflux disease without esophagitis: Secondary | ICD-10-CM | POA: Diagnosis not present

## 2023-05-10 DIAGNOSIS — Z0000 Encounter for general adult medical examination without abnormal findings: Secondary | ICD-10-CM | POA: Diagnosis not present

## 2023-05-17 ENCOUNTER — Encounter: Payer: Self-pay | Admitting: Internal Medicine

## 2023-05-17 ENCOUNTER — Ambulatory Visit: Admitting: Anesthesiology

## 2023-05-17 ENCOUNTER — Ambulatory Visit
Admission: RE | Admit: 2023-05-17 | Discharge: 2023-05-17 | Disposition: A | Discharge status: Home / Self Care | Payer: Medicare HMO | Attending: Internal Medicine | Admitting: Internal Medicine

## 2023-05-17 ENCOUNTER — Encounter
Admission: RE | Disposition: A | Discharge status: Home / Self Care | Payer: Self-pay | Source: Home / Self Care | Attending: Internal Medicine

## 2023-05-17 ENCOUNTER — Other Ambulatory Visit: Payer: Self-pay

## 2023-05-17 DIAGNOSIS — Z6836 Body mass index (BMI) 36.0-36.9, adult: Secondary | ICD-10-CM | POA: Diagnosis not present

## 2023-05-17 DIAGNOSIS — Z8541 Personal history of malignant neoplasm of cervix uteri: Secondary | ICD-10-CM | POA: Insufficient documentation

## 2023-05-17 DIAGNOSIS — Z860100 Personal history of colon polyps, unspecified: Secondary | ICD-10-CM | POA: Diagnosis not present

## 2023-05-17 DIAGNOSIS — E669 Obesity, unspecified: Secondary | ICD-10-CM | POA: Insufficient documentation

## 2023-05-17 DIAGNOSIS — K640 First degree hemorrhoids: Secondary | ICD-10-CM | POA: Insufficient documentation

## 2023-05-17 DIAGNOSIS — F410 Panic disorder [episodic paroxysmal anxiety] without agoraphobia: Secondary | ICD-10-CM | POA: Insufficient documentation

## 2023-05-17 DIAGNOSIS — F17210 Nicotine dependence, cigarettes, uncomplicated: Secondary | ICD-10-CM | POA: Diagnosis not present

## 2023-05-17 DIAGNOSIS — Z1211 Encounter for screening for malignant neoplasm of colon: Secondary | ICD-10-CM | POA: Insufficient documentation

## 2023-05-17 DIAGNOSIS — J4489 Other specified chronic obstructive pulmonary disease: Secondary | ICD-10-CM | POA: Diagnosis not present

## 2023-05-17 DIAGNOSIS — D175 Benign lipomatous neoplasm of intra-abdominal organs: Secondary | ICD-10-CM | POA: Insufficient documentation

## 2023-05-17 DIAGNOSIS — K5730 Diverticulosis of large intestine without perforation or abscess without bleeding: Secondary | ICD-10-CM | POA: Insufficient documentation

## 2023-05-17 DIAGNOSIS — D123 Benign neoplasm of transverse colon: Secondary | ICD-10-CM | POA: Diagnosis not present

## 2023-05-17 DIAGNOSIS — K635 Polyp of colon: Secondary | ICD-10-CM | POA: Diagnosis not present

## 2023-05-17 DIAGNOSIS — I10 Essential (primary) hypertension: Secondary | ICD-10-CM | POA: Insufficient documentation

## 2023-05-17 DIAGNOSIS — F32A Depression, unspecified: Secondary | ICD-10-CM | POA: Insufficient documentation

## 2023-05-17 DIAGNOSIS — I341 Nonrheumatic mitral (valve) prolapse: Secondary | ICD-10-CM | POA: Diagnosis not present

## 2023-05-17 DIAGNOSIS — K219 Gastro-esophageal reflux disease without esophagitis: Secondary | ICD-10-CM | POA: Insufficient documentation

## 2023-05-17 HISTORY — DX: Unspecified ptosis of bilateral eyelids: H02.403

## 2023-05-17 HISTORY — PX: COLONOSCOPY WITH PROPOFOL: SHX5780

## 2023-05-17 HISTORY — DX: Personal history of other specified conditions: Z87.898

## 2023-05-17 HISTORY — DX: Ventral hernia without obstruction or gangrene: K43.9

## 2023-05-17 HISTORY — PX: POLYPECTOMY: SHX5525

## 2023-05-17 SURGERY — COLONOSCOPY WITH PROPOFOL
Anesthesia: General

## 2023-05-17 MED ORDER — LIDOCAINE HCL (PF) 2 % IJ SOLN
INTRAMUSCULAR | Status: DC | PRN
  Administered 2023-05-17: 100 mg via INTRADERMAL

## 2023-05-17 MED ORDER — LIDOCAINE HCL (PF) 2 % IJ SOLN
INTRAMUSCULAR | Status: AC
  Filled 2023-05-17: qty 5

## 2023-05-17 MED ORDER — PROPOFOL 10 MG/ML IV BOLUS
INTRAVENOUS | Status: DC | PRN
  Administered 2023-05-17: 120 ug/kg/min via INTRAVENOUS
  Administered 2023-05-17: 100 mg via INTRAVENOUS

## 2023-05-17 MED ORDER — SODIUM CHLORIDE 0.9 % IV SOLN: INTRAVENOUS | Status: DC

## 2023-05-17 MED ORDER — PROPOFOL 10 MG/ML IV BOLUS
INTRAVENOUS | Status: AC
  Filled 2023-05-17: qty 40

## 2023-05-17 NOTE — Anesthesia Postprocedure Evaluation (Signed)
 Anesthesia Post Note  Patient: Angel Moss  Procedure(s) Performed: COLONOSCOPY WITH PROPOFOL POLYPECTOMY  Patient location during evaluation: PACU Anesthesia Type: General Level of consciousness: awake and alert, oriented and patient cooperative Pain management: pain level controlled Vital Signs Assessment: post-procedure vital signs reviewed and stable Respiratory status: spontaneous breathing, nonlabored ventilation and respiratory function stable Cardiovascular status: blood pressure returned to baseline and stable Postop Assessment: adequate PO intake Anesthetic complications: no   No notable events documented.   Last Vitals:  Vitals:   05/17/23 1017 05/17/23 1025  BP: 118/78   Pulse: 62 61  Resp: 16   Temp:    SpO2: 95% 98%    Last Pain:  Vitals:   05/17/23 1025  TempSrc:   PainSc: 0-No pain                 Reed Breech

## 2023-05-17 NOTE — Transfer of Care (Signed)
 Immediate Anesthesia Transfer of Care Note  Patient: Angel Moss  Procedure(s) Performed: COLONOSCOPY WITH PROPOFOL POLYPECTOMY  Patient Location: Endoscopy Unit  Anesthesia Type:General  Level of Consciousness: drowsy  Airway & Oxygen Therapy: Patient Spontanous Breathing  Post-op Assessment: Report given to RN and Post -op Vital signs reviewed and stable  Post vital signs: Reviewed and stable  Last Vitals:  Vitals Value Taken Time  BP 103/73 05/17/23 1002  Temp 35.9 1002  Pulse 66 05/17/23 1003  Resp 24 05/17/23 1003  SpO2 95 % 05/17/23 1003  Vitals shown include unfiled device data.  Last Pain:  Vitals:   05/17/23 0912  TempSrc: Temporal  PainSc: 0-No pain         Complications: No notable events documented.

## 2023-05-17 NOTE — Interval H&P Note (Signed)
 History and Physical Interval Note:  05/17/2023 9:30 AM  Angel Moss  has presented today for surgery, with the diagnosis of Z86.0100 (ICD-10-CM) - History of colon polyps.  The various methods of treatment have been discussed with the patient and family. After consideration of risks, benefits and other options for treatment, the patient has consented to  Procedure(s) with comments: COLONOSCOPY WITH PROPOFOL (N/A) - DM as a surgical intervention.  The patient's history has been reviewed, patient examined, no change in status, stable for surgery.  I have reviewed the patient's chart and labs.  Questions were answered to the patient's satisfaction.     [place], [place]

## 2023-05-17 NOTE — Op Note (Signed)
 [facility] Gastroenterology Patient Name: Angel Moss Procedure Date: 05/17/2023 9:29 AM MRN: [id number] Account #: [id number] Date of Birth: [birth year] Admit Type: Outpatient Age: 63 Room: [facility] ENDO ROOM 3 Gender: Female Note Status: Finalized Instrument Name: Prentice Docker [id number] Procedure:             Colonoscopy Indications:           Surveillance: Personal history of adenomatous polyps                         on last colonoscopy > 5 years ago Providers:             Royce Macadamia K. Sadiq Mccauley MD, MD Medicines:             Propofol per Anesthesia Complications:         No immediate complications. Estimated blood loss:                         Minimal. Procedure:             Pre-Anesthesia Assessment:                        - Prior to the procedure, a History and Physical was                         performed, and patient medications, allergies and                         sensitivities were reviewed. The patient's tolerance                         of previous anesthesia was reviewed.                        - The risks and benefits of the procedure and the                         sedation options and risks were discussed with the                         patient. All questions were answered and informed                         consent was obtained.                        - Patient identification and proposed procedure were                         verified prior to the procedure by the nurse. The                         procedure was verified in the procedure room.                        - ASA Grade Assessment: III - A patient with severe                         systemic disease.                        -  After reviewing the risks and benefits, the patient                         was deemed in satisfactory condition to undergo the                         procedure.                        After obtaining informed consent, the colonoscope was                         passed  under direct vision. Throughout the procedure,                         the patient's blood pressure, pulse, and oxygen                         saturations were monitored continuously. The                         Colonoscope was introduced through the anus and                         advanced to the the cecum, identified by appendiceal                         orifice and ileocecal valve. The colonoscopy was                         performed without difficulty. The patient tolerated                         the procedure well. The quality of the bowel                         preparation was adequate. The ileocecal valve,                         appendiceal orifice, and rectum were photographed. Findings:      The perianal and digital rectal examinations were normal. Pertinent       negatives include normal sphincter tone and no palpable rectal lesions.      Non-bleeding internal hemorrhoids were found during retroflexion. The       hemorrhoids were Grade I (internal hemorrhoids that do not prolapse).      Many medium-mouthed and small-mouthed diverticula were found in the       sigmoid colon and descending colon.      A 9 mm polyp was found in the transverse colon. The polyp was sessile.       The polyp was removed with a cold snare. Resection and retrieval were       complete.      There was a medium-sized lipoma, 30 mm in diameter, in the transverse       colon. Biopsies were taken with a cold forceps for histology.      The exam was otherwise without abnormality. Impression:            - Non-bleeding internal hemorrhoids.                        -  Diverticulosis in the sigmoid colon and in the                         descending colon.                        - One 9 mm polyp in the transverse colon, removed with                         a cold snare. Resected and retrieved.                        - Medium-sized lipoma in the transverse colon.                         Biopsied.                         - The examination was otherwise normal. Recommendation:        - Patient has a contact number available for                         emergencies. The signs and symptoms of potential                         delayed complications were discussed with the patient.                         Return to normal activities tomorrow. Written                         discharge instructions were provided to the patient.                        - Resume previous diet.                        - Continue present medications.                        - Repeat colonoscopy is recommended for surveillance.                         The colonoscopy date will be determined after                         pathology results from today's exam become available                         for review.                        - Return to GI office PRN.                        - The findings and recommendations were discussed with                         the patient. Procedure Code(s):     --- Professional ---  42595, Colonoscopy, flexible; with removal of                         tumor(s), polyp(s), or other lesion(s) by snare                         technique                        45380, 59, Colonoscopy, flexible; with biopsy, single                         or multiple Diagnosis Code(s):     --- Professional ---                        K57.30, Diverticulosis of large intestine without                         perforation or abscess without bleeding                        D17.5, Benign lipomatous neoplasm of intra-abdominal                         organs                        D12.3, Benign neoplasm of transverse colon (hepatic                         flexure or splenic flexure)                        K64.0, First degree hemorrhoids                        Z86.010, Personal history of colonic polyps CPT copyright 2022 [employer]. All rights reserved. The codes documented in this report  are preliminary and upon coder review may  be revised to meet current compliance requirements. Stanton Kidney MD, MD 05/17/2023 10:04:30 AM This report has been signed electronically. Number of Addenda: 0 Note Initiated On: 05/17/2023 9:29 AM Scope Withdrawal Time: 0 hours 7 minutes 12 seconds  Total Procedure Duration: 0 hours 14 minutes 56 seconds  Estimated Blood Loss:  Estimated blood loss was minimal.      [facility]

## 2023-05-17 NOTE — H&P (Signed)
 Outpatient short stay form Pre-procedure 05/17/2023 9:29 AM Angel Pultz K. Norma Fredrickson, M.D.  Primary Physician: Aram Beecham, M.D.  Reason for visit:  History of colon polyps: 01/20/2018 Physician: Dr. Maggie Font @ [facility] Polyps Removed: Adenomatous Polyp   History of present illness:                            Patient presents for colonoscopy for a personal hx of colon polyps. The patient denies abdominal pain, abnormal weight loss or rectal bleeding.      Current Facility-Administered Medications:    0.9 %  sodium chloride infusion, , Intravenous, Continuous, [place], Boykin Nearing, MD, Last Rate: 20 mL/hr at 05/17/23 0915, New Bag at 05/17/23 0915  Medications Prior to Admission  Medication Sig Dispense Refill Last Dose/Taking   ALPRAZolam (XANAX) 0.5 MG tablet Take 0.5 mg by mouth 2 (two) times daily.   05/16/2023   esomeprazole (NEXIUM) 40 MG capsule TAKE ONE CAPSULE BY MOUTH TWICE DAILY 180 capsule 0 05/16/2023   furosemide (LASIX) 20 MG tablet Take 20 mg by mouth daily.   05/16/2023   hydrochlorothiazide (HYDRODIURIL) 25 MG tablet Take by mouth.   05/16/2023   metoprolol succinate (TOPROL-XL) 50 MG 24 hr tablet TAKE ONE TABLET BY MOUTH ONCE DAILY FOLLOWING A MEAL (Patient taking differently: Take 50 mg by mouth 2 (two) times daily. TAKE 1.5 TABLETS BY MOUTH TWICE A DAY FOLLOWING A MEAL) 90 tablet 1 05/17/2023 at  6:00 AM   sertraline (ZOLOFT) 100 MG tablet Take 100 mg by mouth 2 (two) times daily.    05/16/2023   albuterol (PROVENTIL HFA;VENTOLIN HFA) 108 (90 Base) MCG/ACT inhaler Inhale into the lungs every 6 (six) hours as needed for wheezing or shortness of breath.      doxycycline (VIBRA-TABS) 100 MG tablet Take 1 tablet (100 mg total) by mouth 2 (two) times daily. (Patient not taking: Reported on 05/17/2023) 60 tablet 0 Completed Course   fluticasone (FLOVENT HFA) 110 MCG/ACT inhaler Inhale 2 puffs into the lungs as needed.  (Patient not taking: Reported on 05/17/2023)   Not Taking   promethazine (PHENERGAN)  25 MG tablet Take 1 tablet (25 mg total) by mouth every 6 (six) hours as needed for nausea or vomiting. 20 tablet 0      Allergies  Allergen Reactions   Hydrocodone Nausea And Vomiting and Nausea Only   Oxycodone Hives and Nausea And Vomiting     Past Medical History:  Diagnosis Date   Anxiety    Asthma    Blepharoptosis, bilateral    Cervical cancer (HCC) 1989   Complication of anesthesia    COPD (chronic obstructive pulmonary disease) (HCC)    Depression    with panic attacks   Dyspnea    with exertion   Dysrhythmia    FLUTTERS WHEN ANXIOUS   GERD (gastroesophageal reflux disease)    Hirsutism    History of kidney stones    History of prolonged Q-T interval on ECG    Hyperlipidemia    Hypertension    Mitral valve prolapse    Obesity    Ovarian cyst    Panic attacks    PONV (postoperative nausea and vomiting)    not with recent surgeries   RAD (reactive airway disease)    Sleep apnea    Severe OSA- AHI 32.4, do NOT use CPAP   Ventral hernia without obstruction or gangrene     Review of systems:  Otherwise negative.  Physical Exam  Gen: Alert, oriented. Appears stated age.  HEENT: [place]/AT. PERRLA. Lungs: CTA, no wheezes. CV: RR nl S1, S2. Abd: soft, benign, no masses. BS+ Ext: No edema. Pulses 2+    Planned procedures: Proceed with colonoscopy. The patient understands the nature of the planned procedure, indications, risks, alternatives and potential complications including but not limited to bleeding, infection, perforation, damage to internal organs and possible oversedation/side effects from anesthesia. The patient agrees and gives consent to proceed.  Please refer to procedure notes for findings, recommendations and patient disposition/instructions.     Angel Kenan K. Norma Fredrickson, M.D. Gastroenterology 05/17/2023  9:29 AM

## 2023-05-17 NOTE — Anesthesia Preprocedure Evaluation (Addendum)
 Anesthesia Evaluation  Patient identified by MRN, date of birth, ID band Patient awake    Reviewed: Allergy & Precautions, NPO status , Patient's Chart, lab work & pertinent test results  History of Anesthesia Complications (+) PONV and history of anesthetic complications  Airway Mallampati: IV   Neck ROM: Full    Dental no notable dental hx.    Pulmonary asthma , COPD, Current Smoker (2-3 cigarettes per day) and Patient abstained from smoking.   Pulmonary exam normal breath sounds clear to auscultation       Cardiovascular hypertension, Normal cardiovascular exam Rhythm:Regular Rate:Normal     Neuro/Psych  PSYCHIATRIC DISORDERS (panic disorder) Anxiety Depression    negative neurological ROS     GI/Hepatic ,GERD  ,,  Endo/Other  Obesity   Renal/GU Renal disease (nephrolithiasis)     Musculoskeletal   Abdominal   Peds  Hematology negative hematology ROS (+)   Anesthesia Other Findings   Reproductive/Obstetrics Cervical CA                             Anesthesia Physical Anesthesia Plan  ASA: 3  Anesthesia Plan: General   Post-op Pain Management:    Induction: Intravenous  PONV Risk Score and Plan: 3 and Propofol infusion, TIVA and Treatment may vary due to age or medical condition  Airway Management Planned: Natural Airway  Additional Equipment:   Intra-op Plan:   Post-operative Plan:   Informed Consent: I have reviewed the patients History and Physical, chart, labs and discussed the procedure including the risks, benefits and alternatives for the proposed anesthesia with the patient or authorized representative who has indicated his/her understanding and acceptance.       Plan Discussed with: CRNA  Anesthesia Plan Comments: (LMA/GETA backup discussed.  Patient consented for risks of anesthesia including but not limited to:  - adverse reactions to medications - damage  to eyes, teeth, lips or other oral mucosa - nerve damage due to positioning  - sore throat or hoarseness - damage to heart, brain, nerves, lungs, other parts of body or loss of life  Informed patient about role of CRNA in peri- and intra-operative care.  Patient voiced understanding.)        Anesthesia Quick Evaluation

## 2023-05-18 LAB — SURGICAL PATHOLOGY

## 2023-06-17 DIAGNOSIS — R509 Fever, unspecified: Secondary | ICD-10-CM | POA: Diagnosis not present

## 2023-06-17 DIAGNOSIS — R5381 Other malaise: Secondary | ICD-10-CM | POA: Diagnosis not present

## 2023-06-17 DIAGNOSIS — R5383 Other fatigue: Secondary | ICD-10-CM | POA: Diagnosis not present

## 2023-06-17 DIAGNOSIS — R197 Diarrhea, unspecified: Secondary | ICD-10-CM | POA: Diagnosis not present

## 2023-06-17 DIAGNOSIS — R053 Chronic cough: Secondary | ICD-10-CM | POA: Diagnosis not present

## 2023-07-18 DIAGNOSIS — H524 Presbyopia: Secondary | ICD-10-CM | POA: Diagnosis not present

## 2023-07-18 DIAGNOSIS — Z0100 Encounter for examination of eyes and vision without abnormal findings: Secondary | ICD-10-CM | POA: Diagnosis not present

## 2023-07-18 DIAGNOSIS — E119 Type 2 diabetes mellitus without complications: Secondary | ICD-10-CM | POA: Diagnosis not present

## 2023-07-18 DIAGNOSIS — H2513 Age-related nuclear cataract, bilateral: Secondary | ICD-10-CM | POA: Diagnosis not present

## 2023-08-29 ENCOUNTER — Emergency Department
Admission: EM | Admit: 2023-08-29 | Discharge: 2023-08-29 | Disposition: A | Discharge status: Home / Self Care | Attending: Emergency Medicine | Admitting: Emergency Medicine

## 2023-08-29 ENCOUNTER — Emergency Department

## 2023-08-29 ENCOUNTER — Other Ambulatory Visit: Payer: Self-pay

## 2023-08-29 ENCOUNTER — Encounter: Payer: Self-pay | Admitting: Emergency Medicine

## 2023-08-29 DIAGNOSIS — K4020 Bilateral inguinal hernia, without obstruction or gangrene, not specified as recurrent: Secondary | ICD-10-CM | POA: Diagnosis not present

## 2023-08-29 DIAGNOSIS — Z90710 Acquired absence of both cervix and uterus: Secondary | ICD-10-CM | POA: Diagnosis not present

## 2023-08-29 DIAGNOSIS — N83202 Unspecified ovarian cyst, left side: Secondary | ICD-10-CM | POA: Diagnosis not present

## 2023-08-29 DIAGNOSIS — R1032 Left lower quadrant pain: Secondary | ICD-10-CM | POA: Diagnosis not present

## 2023-08-29 DIAGNOSIS — E119 Type 2 diabetes mellitus without complications: Secondary | ICD-10-CM | POA: Insufficient documentation

## 2023-08-29 DIAGNOSIS — N2889 Other specified disorders of kidney and ureter: Secondary | ICD-10-CM | POA: Diagnosis not present

## 2023-08-29 DIAGNOSIS — K5730 Diverticulosis of large intestine without perforation or abscess without bleeding: Secondary | ICD-10-CM | POA: Diagnosis not present

## 2023-08-29 DIAGNOSIS — N200 Calculus of kidney: Secondary | ICD-10-CM | POA: Diagnosis not present

## 2023-08-29 LAB — COMPREHENSIVE METABOLIC PANEL WITH GFR
ALT: 21 U/L (ref 0–44)
AST: 16 U/L (ref 15–41)
Albumin: 4.5 g/dL (ref 3.5–5.0)
Alkaline Phosphatase: 57 U/L (ref 38–126)
Anion gap: 12 (ref 5–15)
BUN: 18 mg/dL (ref 8–23)
CO2: 26 mmol/L (ref 22–32)
Calcium: 9.3 mg/dL (ref 8.9–10.3)
Chloride: 96 mmol/L — ABNORMAL LOW (ref 98–111)
Creatinine, Ser: 0.83 mg/dL (ref 0.44–1.00)
GFR, Estimated: >60 mL/min (ref >60)
Glucose, Bld: 136 mg/dL — ABNORMAL HIGH (ref 70–99)
Potassium: 3.7 mmol/L (ref 3.5–5.1)
Sodium: 134 mmol/L — ABNORMAL LOW (ref 135–145)
Total Bilirubin: 0.6 mg/dL (ref 0.0–1.2)
Total Protein: 7.3 g/dL (ref 6.5–8.1)

## 2023-08-29 LAB — CBC
HCT: 38.3 % (ref 36.0–46.0)
Hemoglobin: 13.3 g/dL (ref 12.0–15.0)
MCH: 28.7 pg (ref 26.0–34.0)
MCHC: 34.7 g/dL (ref 30.0–36.0)
MCV: 82.5 fL (ref 80.0–100.0)
Platelets: 179 10*3/uL (ref 150–400)
RBC: 4.64 MIL/uL (ref 3.87–5.11)
RDW: 13.1 % (ref 11.5–15.5)
WBC: 6.5 10*3/uL (ref 4.0–10.5)
nRBC: 0 % (ref 0.0–0.2)

## 2023-08-29 LAB — URINALYSIS, ROUTINE W REFLEX MICROSCOPIC
Bilirubin Urine: NEGATIVE
Glucose, UA: NEGATIVE mg/dL
Hgb urine dipstick: NEGATIVE
Ketones, ur: NEGATIVE mg/dL
Leukocytes,Ua: NEGATIVE
Nitrite: NEGATIVE
Protein, ur: NEGATIVE mg/dL
Specific Gravity, Urine: 1.018 (ref 1.005–1.030)
pH: 6 (ref 5.0–8.0)

## 2023-08-29 LAB — LIPASE, BLOOD: Lipase: 43 U/L (ref 11–51)

## 2023-08-29 MED ORDER — ONDANSETRON HCL 4 MG/2ML IJ SOLN
4.0000 mg | Freq: Once | INTRAMUSCULAR | Status: AC
  Administered 2023-08-29: 4 mg via INTRAVENOUS
  Filled 2023-08-29: qty 2

## 2023-08-29 MED ORDER — KETOROLAC TROMETHAMINE 15 MG/ML IJ SOLN
15.0000 mg | Freq: Once | INTRAMUSCULAR | Status: AC
  Administered 2023-08-29: 15 mg via INTRAVENOUS
  Filled 2023-08-29: qty 1

## 2023-08-29 MED ORDER — FENTANYL CITRATE PF 50 MCG/ML IJ SOSY
75.0000 ug | PREFILLED_SYRINGE | Freq: Once | INTRAMUSCULAR | Status: AC
  Administered 2023-08-29: 75 ug via INTRAVENOUS
  Filled 2023-08-29: qty 2

## 2023-08-29 MED ORDER — IBUPROFEN 600 MG PO TABS: 600.0000 mg | ORAL_TABLET | Freq: Three times a day (TID) | ORAL | 0 refills | Status: AC | PRN

## 2023-08-29 MED ORDER — IOHEXOL 300 MG/ML  SOLN
100.0000 mL | Freq: Once | INTRAMUSCULAR | Status: AC | PRN
  Administered 2023-08-29: 100 mL via INTRAVENOUS

## 2023-08-29 MED ORDER — LIDOCAINE 5 % EX PTCH: 1.0000 | MEDICATED_PATCH | CUTANEOUS | 0 refills | Status: AC

## 2023-08-29 MED ORDER — LIDOCAINE 5 % EX PTCH
1.0000 | MEDICATED_PATCH | CUTANEOUS | Status: DC
  Administered 2023-08-29: 1 via TRANSDERMAL
  Filled 2023-08-29: qty 1

## 2023-08-29 MED ORDER — SODIUM CHLORIDE 0.9 % IV BOLUS
500.0000 mL | Freq: Once | INTRAVENOUS | Status: AC
  Administered 2023-08-29: 500 mL via INTRAVENOUS

## 2023-08-29 NOTE — ED Triage Notes (Addendum)
 Pt arrived via POV with reports of LLQ pain that started about 3 days ago, pt c/o decreased appetite, nausea, pt reports paniculectomy 4 years ago, lap chole in the past.  Pt c/o feeling bloated as well. Pt expresses concerns about area where she had her peniculectomy and describes it as a freak show down there.   Pt reports mother died last year and husband died 2 months ago.

## 2023-08-29 NOTE — ED Notes (Signed)
 Lab called stated urine specimen cup was not secured shut and leaked in the bag, will need to recollect urine sample.

## 2023-08-29 NOTE — Discharge Instructions (Addendum)
 Your workup was reassuring other than a cyst on your left ovary.  Take the Tylenol  1 g every 8 hours with ibuprofen and lidocaine  patches for up to 1 week.  If you develop any black tarry stools then discontinue medications and return to the ER immediately.  Return to ER for fevers, worsening pain or any other concerns.  Otherwise call OB/GYN to make a follow-up   IMPRESSION: *No acute findings in the abdomen or pelvis. *Right nephrolithiasis without hydronephrosis. *Diverticulosis of the sigmoid colon without inflammatory changes. *Left adnexal cyst of 3.6 x 3.5 cm. *Small bilateral inguinal hernias containing fat. *Status post cholecystectomy and hysterectomy. *Moderate amount of residual fecal material throughout the colon without obstruction or constipation. *No follow-up recommended for simple ovarian cysts less than 3 cm.   Status post cholecystectomy and hysterectomy. Moderate amount of residual fecal material throughout the colon without obstruction or constipation. No follow-up imaging recommended for the left adnexal cyst.  1. No evidence of left ovarian torsion.  2. A 3.2 cm left ovarian cyst with subtle low level internal echoes,  which may represent a hemorrhagic cyst or endometrioma.  3. Status post hysterectomy. Right ovary not seen.

## 2023-08-29 NOTE — ED Provider Notes (Signed)
 [facility] Provider Note    Event Date/Time   First MD Initiated Contact with Patient 08/29/23 [phone]     (approximate)   History   Abdominal Pain   HPI  Angel Moss is a 63 y.o. female with diabetes, depression, emphysema who comes in with lower abdominal pain.  Patient reports left lower quadrant pain that started 3 days ago and increased to getting worse.  She reports some nausea and decreased appetite.  She does report prior history of gallbladder removal and panniculectomy 4 years ago.  After her panniculectomy she reports that she had some puffing of the suprapubic area but she was not able to go back in to have this part reduced due to the doctor having died from COVID.  Which is why she describes it as a freak show with triage.  She states the pain however is more on the left lower quadrant not bloated suprapubic area.  She denies any chest pain, shortness of breath.  Does report a colonoscopy on 05/17/2023  Physical Exam   Triage Vital Signs: ED Triage Vitals  Encounter Vitals Group     BP 08/29/23 0531 (!) 178/83     Girls Systolic BP Percentile --      Girls Diastolic BP Percentile --      Boys Systolic BP Percentile --      Boys Diastolic BP Percentile --      Pulse Rate 08/29/23 0531 66     Resp 08/29/23 0531 18     Temp 08/29/23 0531 97.9 F (36.6 C)     Temp Source 08/29/23 0531 Oral     SpO2 08/29/23 0531 98 %     Weight 08/29/23 0533 210 lb (95.3 kg)     Height 08/29/23 0533 5' 4 (1.626 m)     Head Circumference --      Peak Flow --      Pain Score 08/29/23 0533 10     Pain Loc --      Pain Education --      Exclude from Growth Chart --     Most recent vital signs: Vitals:   08/29/23 0531 08/29/23 0645  BP: (!) 178/83 139/79  Pulse: 66 66  Resp: 18 17  Temp: 97.9 F (36.6 C)   SpO2: 98% 99%     General: Awake, no distress.  CV:  Good peripheral perfusion.  Resp:  Normal effort.  Abd:  No distention.  Tender in left  lower quadrant without rebound, guarding.  Panniculectomy scar noted, some bulging suprapubically that she reports is baseline since her surgery. Other:     ED Results / Procedures / Treatments   Labs (all labs ordered are listed, but only abnormal results are displayed) Labs Reviewed  COMPREHENSIVE METABOLIC PANEL WITH GFR - Abnormal; Notable for the following components:      Result Value   Sodium 134 (*)    Chloride 96 (*)    Glucose, Bld 136 (*)    All other components within normal limits  LIPASE, BLOOD  CBC  URINALYSIS, ROUTINE W REFLEX MICROSCOPIC      RADIOLOGY I have reviewed the ct personally and interpreted patient has an enlarged bladder with no obvious kidney stones in the ureters.  She does have a kidney stone in the right kidney   PROCEDURES:  Critical Care performed: No  Procedures   MEDICATIONS ORDERED IN ED: Medications  lidocaine  (LIDODERM ) 5 % 1 patch (1 patch Transdermal Patch  Applied 08/29/23 1028)  ondansetron  (ZOFRAN ) injection 4 mg (4 mg Intravenous Given 08/29/23 0727)  fentaNYL  (SUBLIMAZE ) injection 75 mcg (75 mcg Intravenous Given 08/29/23 0727)  sodium chloride  0.9 % bolus 500 mL (0 mLs Intravenous Stopped 08/29/23 1031)  iohexol  (OMNIPAQUE ) 300 MG/ML solution 100 mL (100 mLs Intravenous Contrast Given 08/29/23 0817)  ketorolac (TORADOL) 15 MG/ML injection 15 mg (15 mg Intravenous Given 08/29/23 1028)     IMPRESSION / MDM / ASSESSMENT AND PLAN / ED COURSE  I reviewed the triage vital signs and the nursing notes.   Patient's presentation is most consistent with acute presentation with potential threat to life or bodily function.   Patient comes in with left lower quadrant pain will get CT imaging to evaluate for diverticulitis, obstruction, perforation or other acute pathology.  No upper abdominal pain chest pain, shortness of breath to suggest ACS equivalent.  Will check urine to rule out UTI.  Patient given some IV fentanyl  IV Zofran  to help  with symptoms  Lipase is normal CMP shows slightly low sodium and chloride we will give some fluids CBC reassuring  IMPRESSION: *No acute findings in the abdomen or pelvis. *Right nephrolithiasis without hydronephrosis. *Diverticulosis of the sigmoid colon without inflammatory changes. *Left adnexal cyst of 3.6 x 3.5 cm. *Small bilateral inguinal hernias containing fat. *Status post cholecystectomy and hysterectomy. *Moderate amount of residual fecal material throughout the colon without obstruction or constipation. *No follow-up recommended for simple ovarian cysts less than 3 cm.   Status post cholecystectomy and hysterectomy. Moderate amount of residual fecal material throughout the colon without obstruction or constipation. No follow-up imaging recommended for the left adnexal cyst.  Discussed CT scan with patient incidental findings.  Patient was able to urinate with a bladder scan that was normal afterwards.  She had 500 cc of urine out.  She continues to have significant pain so we will get an ultrasound to evaluate for torsion given she does have a cyst on the side.  Her urine is without evidence of UTI  1. No evidence of left ovarian torsion. 2. A 3.2 cm left ovarian cyst with subtle low level internal echoes, which may represent a hemorrhagic cyst or endometrioma. 3. Status post hysterectomy. Right ovary not seen.  Discussed with patient reassuring ultrasound I do not feel that there is intermittent torsion as she reports the pain has been constant and she was having the pain during ultrasound I does demonstrate normal flow.  We discussed following this up with OB/GYN to ensure it is not more concerning.  We discussed there is no obvious signs of cancer but she needs to follow-up with OB/GYN given her age and this cyst they may decide to do further workup including MRIs to ensure no signs of cancer.  She expressed understanding.  We discussed pain management with Tylenol ,  ibuprofen, lidocaine  patches as she is allergy to oxycodone .  She expressed understanding and felt comfortable with this plan   The patient is on the cardiac monitor to evaluate for evidence of arrhythmia and/or significant heart rate changes.      FINAL CLINICAL IMPRESSION(S) / ED DIAGNOSES   Final diagnoses:  Cyst of left ovary     Rx / DC Orders   ED Discharge Orders     None        Note:  This document was prepared using Dragon voice recognition software and may include unintentional dictation errors.   Lubertha Rush, MD 08/29/23 1106

## 2023-08-31 ENCOUNTER — Encounter: Payer: Self-pay | Admitting: Obstetrics and Gynecology

## 2023-08-31 ENCOUNTER — Ambulatory Visit: Admitting: Obstetrics and Gynecology

## 2023-08-31 VITALS — BP 152/94 | HR 66 | Ht 64.0 in | Wt 217.0 lb

## 2023-08-31 DIAGNOSIS — N83202 Unspecified ovarian cyst, left side: Secondary | ICD-10-CM | POA: Diagnosis not present

## 2023-08-31 DIAGNOSIS — R102 Pelvic and perineal pain: Secondary | ICD-10-CM | POA: Diagnosis not present

## 2023-08-31 DIAGNOSIS — Z7689 Persons encountering health services in other specified circumstances: Secondary | ICD-10-CM

## 2023-08-31 NOTE — Progress Notes (Signed)
 HPI:      [name] is a 62 y.o. G1P0 who LMP was No LMP recorded. Patient has had a hysterectomy.  Subjective:   She presents today after being seen in the emergency department for pelvic pain.  She states that the pain has continued since her visit in the ED.  It is not disabling but it is very annoying to her every day. Patient has previously had a hysterectomy but has both ovaries.    Hx: The following portions of the patient's history were reviewed and updated as appropriate:             She  has a past medical history of Anxiety, Asthma, Blepharoptosis, bilateral, Cervical cancer (HCC) (1989), Complication of anesthesia, COPD (chronic obstructive pulmonary disease) (HCC), Depression, Dyspnea, Dysrhythmia, GERD (gastroesophageal reflux disease), Hirsutism, History of kidney stones, History of prolonged Q-T interval on ECG, Hyperlipidemia, Hypertension, Mitral valve prolapse, Obesity, Ovarian cyst, Panic attacks, PONV (postoperative nausea and vomiting), RAD (reactive airway disease), Sleep apnea, and Ventral hernia without obstruction or gangrene. She does not have any pertinent problems on file. She  has a past surgical history that includes Kidney stone surgery (Left, 11/23/2011); Colonoscopy; Cervical cone biopsy; Tonsillectomy and adenoidectomy; Vaginal hysterectomy (1989); Colonoscopy (2015); Cholecystectomy (04/14/2012); Hernia repair (06/02/2015); Ventral hernia repair (N/A, 06/02/2015); Carpal tunnel release (2011); eyebrow lift (12/2011); Colonoscopy with propofol  (N/A, 01/20/2018); Abdominoplasty (N/A, 04/17/2018); Blepharoplasty; Nephrolithotomy (N/A); Colonoscopy with propofol  (N/A, 05/17/2023); and polypectomy (05/17/2023). Her family history includes Breast cancer in her paternal grandmother; Cancer in her paternal grandmother; Heart disease in her father; Hypertension in her mother. She  reports that she quit smoking about 5 years ago. Her smoking use included cigarettes. She  started smoking about 15 years ago. She has a 2.5 pack-year smoking history. She has never used smokeless tobacco. She reports that she does not drink alcohol and does not use drugs. She has a current medication list which includes the following prescription(s): albuterol , alprazolam , esomeprazole , furosemide , hydrochlorothiazide , ibuprofen, lidocaine , metoprolol  succinate, promethazine , and fluticasone . She is allergic to hydrocodone , hydrocodone -acetaminophen , and oxycodone .       Review of Systems:  Review of Systems  Constitutional: Denied constitutional symptoms, night sweats, recent illness, fatigue, fever, insomnia and weight loss.  Eyes: Denied eye symptoms, eye pain, photophobia, vision change and visual disturbance.  Ears/Nose/Throat/Neck: Denied ear, nose, throat or neck symptoms, hearing loss, nasal discharge, sinus congestion and sore throat.  Cardiovascular: Denied cardiovascular symptoms, arrhythmia, chest pain/pressure, edema, exercise intolerance, orthopnea and palpitations.  Respiratory: Denied pulmonary symptoms, asthma, pleuritic pain, productive sputum, cough, dyspnea and wheezing.  Gastrointestinal: Denied, gastro-esophageal reflux, melena, nausea and vomiting.  Genitourinary: See HPI for additional information.  Musculoskeletal: Denied musculoskeletal symptoms, stiffness, swelling, muscle weakness and myalgia.  Dermatologic: Denied dermatology symptoms, rash and scar.  Neurologic: Denied neurology symptoms, dizziness, headache, neck pain and syncope.  Psychiatric: Denied psychiatric symptoms, anxiety and depression.  Endocrine: Denied endocrine symptoms including hot flashes and night sweats.   Meds:   Current Outpatient Medications on File Prior to Visit  Medication Sig Dispense Refill   albuterol  (PROVENTIL  HFA;VENTOLIN  HFA) 108 (90 Base) MCG/ACT inhaler Inhale into the lungs every 6 (six) hours as needed for wheezing or shortness of breath.     ALPRAZolam  (XANAX )  0.5 MG tablet Take 0.5 mg by mouth 2 (two) times daily.     esomeprazole  (NEXIUM ) 40 MG capsule TAKE ONE CAPSULE BY MOUTH TWICE DAILY 180 capsule 0   furosemide  (LASIX ) 20 MG tablet  Take 20 mg by mouth daily.     hydrochlorothiazide  (HYDRODIURIL ) 25 MG tablet Take by mouth.     ibuprofen (ADVIL) 600 MG tablet Take 1 tablet (600 mg total) by mouth every 8 (eight) hours as needed for up to 7 days. 21 tablet 0   lidocaine  (LIDODERM ) 5 % Place 1 patch onto the skin daily for 10 days. Remove & Discard patch within 12 hours or as directed by MD 10 patch 0   metoprolol  succinate (TOPROL -XL) 50 MG 24 hr tablet TAKE ONE TABLET BY MOUTH ONCE DAILY FOLLOWING A MEAL (Patient taking differently: Take 50 mg by mouth 2 (two) times daily. TAKE 1.5 TABLETS BY MOUTH TWICE A DAY FOLLOWING A MEAL) 90 tablet 1   promethazine  (PHENERGAN ) 25 MG tablet Take 1 tablet (25 mg total) by mouth every 6 (six) hours as needed for nausea or vomiting. 20 tablet 0   fluticasone  (FLOVENT  HFA) 110 MCG/ACT inhaler Inhale 2 puffs into the lungs as needed.  (Patient not taking: Reported on 08/31/2023)     No current facility-administered medications on file prior to visit.      Objective:     Vitals:   08/31/23 1420  BP: (!) 152/94  Pulse: 66   Filed Weights   08/31/23 1420  Weight: 217 lb (98.4 kg)              CT and ultrasound results reviewed.  Think suggestion was made that this could be a hemorrhagic cyst or an endometrioma.  This would be very unusual in a postmenopausal woman.  It is a small cyst approximately 3.2 cm in size.          Assessment:    G1P0 Patient Active Problem List   Diagnosis Date Noted   Status post panniculectomy 04/17/2018   Ventral hernia without obstruction or gangrene 05/16/2015   Clinical depression 11/20/2014   History of colon polyps 11/20/2014   History of prolonged Q-T interval on ECG 11/20/2014   RAD (reactive airway disease) 11/20/2014   H/O renal calculi 03/25/2014    Combined fat and carbohydrate induced hyperlipemia 03/25/2014   Medicare annual wellness visit, subsequent 09/10/2013   Right knee pain 08/03/2012   Osteoarthritis of both knees 06/06/2012   Screening for colon cancer 06/06/2012   Anxiety state 06/06/2012   Tobacco abuse 10/24/2011   Hypertension 12/22/2010   GERD (gastroesophageal reflux disease) 12/22/2010   Hyperlipidemia 12/22/2010   Obesity 12/22/2010     1. Establishing care with new doctor, encounter for   2. Left ovarian cyst   3. Pelvic pain in female     Patient likely having some pelvic pain from a small ovarian cyst.   Plan:            1.  Recommend ROMA testing.  2.  I would recommend expectant management as the cyst will likely resolve if the Roma testing is negative.  At this time the patient is convinced that she wants definitive surgery removing both ovaries because she states that they are simply a breeding ground for future cancer and are not doing anything at this time.  She is unhappy that she has a problem with one of them.   Orders Orders Placed This Encounter  Procedures   Ovarian Malignancy Risk-ROMA    No orders of the defined types were placed in this encounter.     F/U  Return for We will contact her with any abnormal test results.  Delice Felt, M.D. 08/31/2023  3:03 PM

## 2023-08-31 NOTE — Progress Notes (Signed)
 Patient presents today due to an Ed follow-up. Patient had U/S and CT both showing a left ovarian cyst. Reports being in pain for the last 3-4 days, states it has been radiating in her legs and across her abdomen. Additional concern of stomach bloating, aching along with constipation.

## 2023-09-01 LAB — OVARIAN MALIGNANCY RISK-ROMA
Cancer Antigen (CA) 125: 7.8 U/mL (ref 0.0–38.1)
HE4: 53.2 pmol/L (ref 0.0–96.5)
Postmenopausal ROMA: 0.79
Premenopausal ROMA: 0.82

## 2023-09-01 LAB — POSTMENOPAUSAL INTERP: LOW

## 2023-09-01 LAB — PREMENOPAUSAL INTERP: LOW

## 2023-09-02 ENCOUNTER — Ambulatory Visit: Payer: Self-pay

## 2023-09-07 DIAGNOSIS — Z79899 Other long term (current) drug therapy: Secondary | ICD-10-CM | POA: Diagnosis not present

## 2023-09-07 DIAGNOSIS — E1165 Type 2 diabetes mellitus with hyperglycemia: Secondary | ICD-10-CM | POA: Diagnosis not present

## 2023-09-07 DIAGNOSIS — I10 Essential (primary) hypertension: Secondary | ICD-10-CM | POA: Diagnosis not present

## 2023-09-07 DIAGNOSIS — Z1331 Encounter for screening for depression: Secondary | ICD-10-CM | POA: Diagnosis not present

## 2023-09-07 DIAGNOSIS — E669 Obesity, unspecified: Secondary | ICD-10-CM | POA: Diagnosis not present

## 2023-09-07 DIAGNOSIS — F321 Major depressive disorder, single episode, moderate: Secondary | ICD-10-CM | POA: Diagnosis not present

## 2023-09-07 DIAGNOSIS — Z1231 Encounter for screening mammogram for malignant neoplasm of breast: Secondary | ICD-10-CM | POA: Diagnosis not present

## 2023-09-07 DIAGNOSIS — E782 Mixed hyperlipidemia: Secondary | ICD-10-CM | POA: Diagnosis not present

## 2023-09-07 DIAGNOSIS — Z6838 Body mass index (BMI) 38.0-38.9, adult: Secondary | ICD-10-CM | POA: Diagnosis not present

## 2023-09-08 ENCOUNTER — Other Ambulatory Visit: Payer: Self-pay | Admitting: Internal Medicine

## 2023-09-08 DIAGNOSIS — Z1231 Encounter for screening mammogram for malignant neoplasm of breast: Secondary | ICD-10-CM

## 2023-09-28 DIAGNOSIS — B3789 Other sites of candidiasis: Secondary | ICD-10-CM | POA: Diagnosis not present

## 2023-09-29 ENCOUNTER — Ambulatory Visit: Admitting: Obstetrics and Gynecology

## 2023-10-06 ENCOUNTER — Other Ambulatory Visit: Payer: Self-pay | Admitting: Medical Genetics

## 2023-10-12 ENCOUNTER — Ambulatory Visit
Admission: RE | Admit: 2023-10-12 | Discharge: 2023-10-12 | Disposition: A | Discharge status: Home / Self Care | Source: Ambulatory Visit | Attending: Internal Medicine | Admitting: Internal Medicine

## 2023-10-12 DIAGNOSIS — Z1231 Encounter for screening mammogram for malignant neoplasm of breast: Secondary | ICD-10-CM | POA: Diagnosis not present

## 2023-10-19 DIAGNOSIS — E785 Hyperlipidemia, unspecified: Secondary | ICD-10-CM | POA: Diagnosis not present

## 2023-12-08 DIAGNOSIS — F321 Major depressive disorder, single episode, moderate: Secondary | ICD-10-CM | POA: Diagnosis not present

## 2023-12-08 DIAGNOSIS — R8290 Unspecified abnormal findings in urine: Secondary | ICD-10-CM | POA: Diagnosis not present

## 2023-12-08 DIAGNOSIS — J431 Panlobular emphysema: Secondary | ICD-10-CM | POA: Diagnosis not present

## 2023-12-08 DIAGNOSIS — Z79899 Other long term (current) drug therapy: Secondary | ICD-10-CM | POA: Diagnosis not present

## 2023-12-08 DIAGNOSIS — I10 Essential (primary) hypertension: Secondary | ICD-10-CM | POA: Diagnosis not present

## 2023-12-08 DIAGNOSIS — E782 Mixed hyperlipidemia: Secondary | ICD-10-CM | POA: Diagnosis not present

## 2023-12-08 DIAGNOSIS — E66812 Obesity, class 2: Secondary | ICD-10-CM | POA: Diagnosis not present

## 2023-12-08 DIAGNOSIS — E1165 Type 2 diabetes mellitus with hyperglycemia: Secondary | ICD-10-CM | POA: Diagnosis not present

## 2023-12-08 DIAGNOSIS — R079 Chest pain, unspecified: Secondary | ICD-10-CM | POA: Diagnosis not present

## 2023-12-30 DIAGNOSIS — R0789 Other chest pain: Secondary | ICD-10-CM | POA: Diagnosis not present

## 2024-01-06 ENCOUNTER — Other Ambulatory Visit: Payer: Self-pay | Admitting: Medical Genetics

## 2024-01-06 DIAGNOSIS — Z006 Encounter for examination for normal comparison and control in clinical research program: Secondary | ICD-10-CM
